# Patient Record
Sex: Female | Born: 1985 | Race: White | Hispanic: No | Smoking: Never smoker
Health system: Southern US, Community
[De-identification: ages and names within clinical notes are randomized; demographics above are authoritative.]

## PROBLEM LIST (undated history)

## (undated) DIAGNOSIS — U071 COVID-19: Secondary | ICD-10-CM

## (undated) HISTORY — PX: LUMBAR MICRODISCECTOMY: SHX99

## (undated) HISTORY — DX: COVID-19: U07.1

## (undated) HISTORY — PX: CHOLECYSTECTOMY: SHX55

## (undated) HISTORY — PX: APPENDECTOMY: SHX54

## (undated) HISTORY — PX: FRACTURE SURGERY: SHX138

---
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
## 2008-11-04 ENCOUNTER — Emergency Department (HOSPITAL_COMMUNITY): Admission: EM | Admit: 2008-11-04 | Discharge: 2008-11-05 | Payer: Self-pay | Admitting: Emergency Medicine

## 2008-11-24 ENCOUNTER — Ambulatory Visit (HOSPITAL_COMMUNITY): Admission: RE | Admit: 2008-11-24 | Discharge: 2008-11-25 | Payer: Self-pay | Admitting: Surgery

## 2008-11-24 ENCOUNTER — Encounter (INDEPENDENT_AMBULATORY_CARE_PROVIDER_SITE_OTHER): Payer: Self-pay | Admitting: Surgery
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
## 2010-09-04 IMAGING — US ABDOMEN
1 series · 14 of 25 positions shown · non-contrast
Comparison: None available.

CLINICAL DATA: Abdominal pain.

COMPLETE ABDOMINAL ULTRASOUND

[Series 1: abdomen · 0.30mm/px · 14 of 52 slices shown]
[im 1/52]
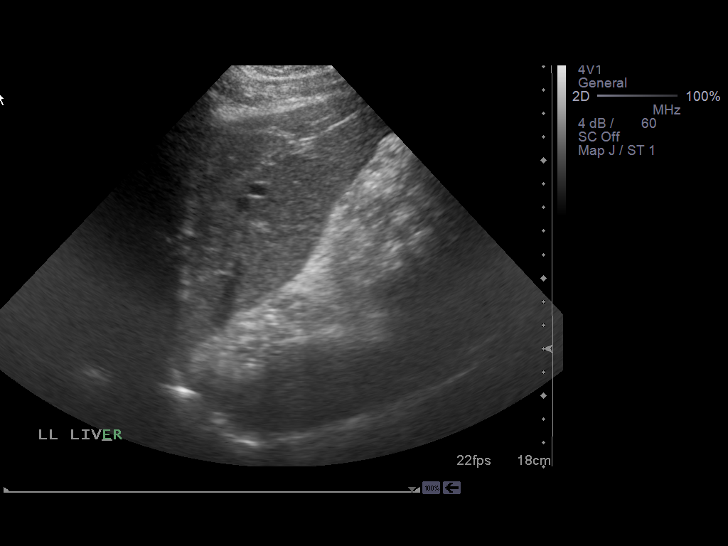
[im 5/52]
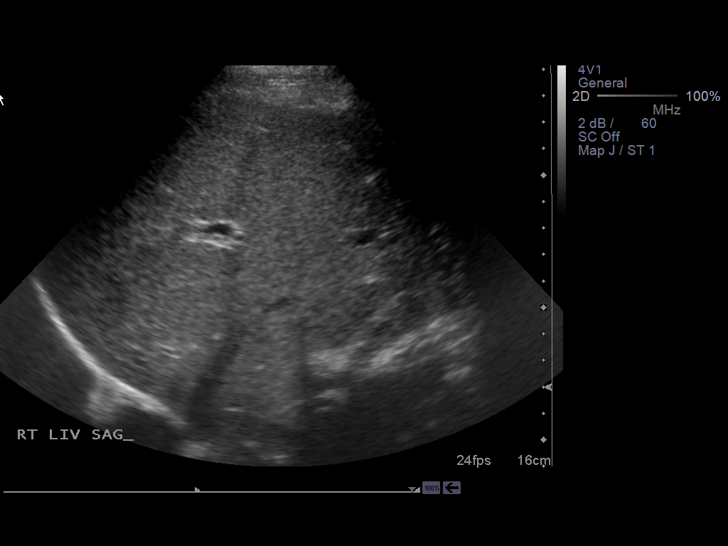
[im 9/52]
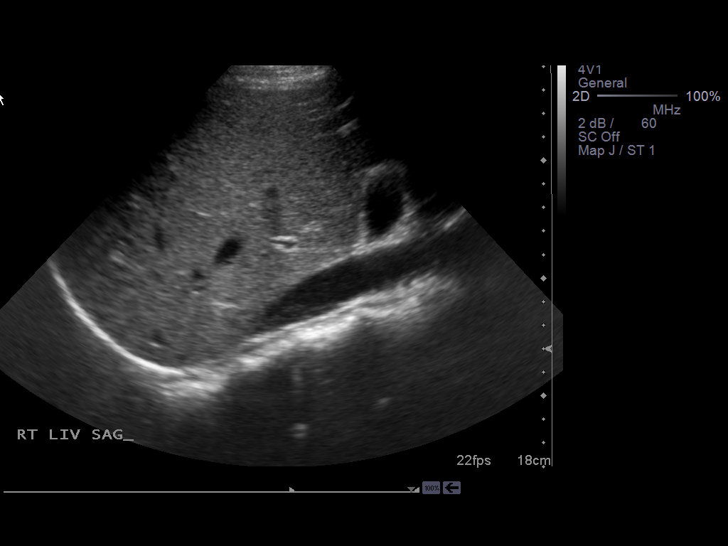
[im 13/52]
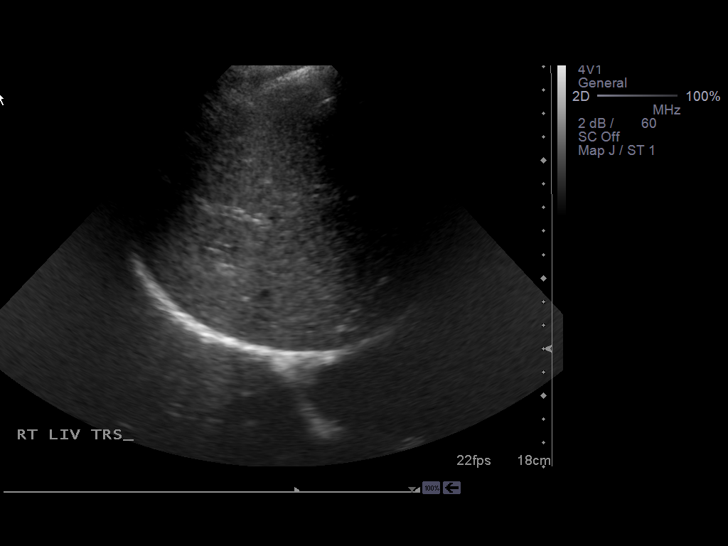
[im 18/52]
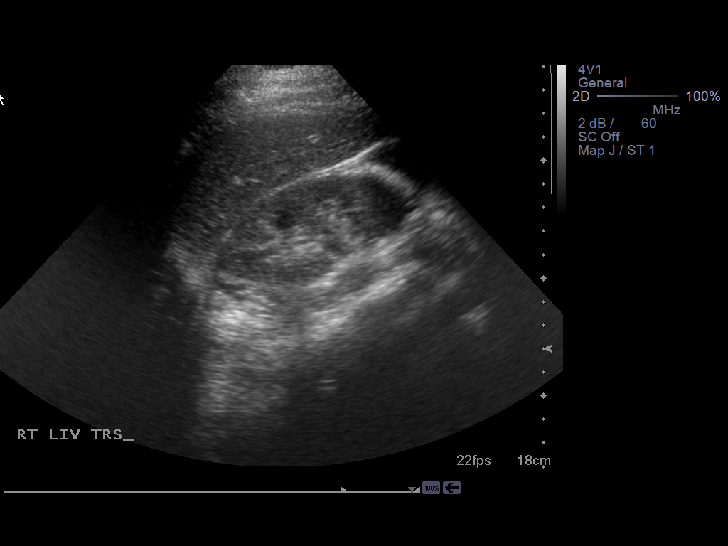
[im 20/52]
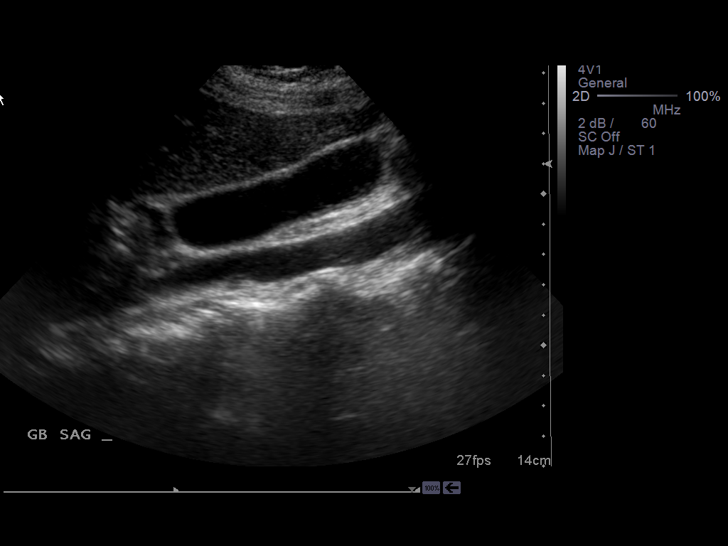
[im 24/52]
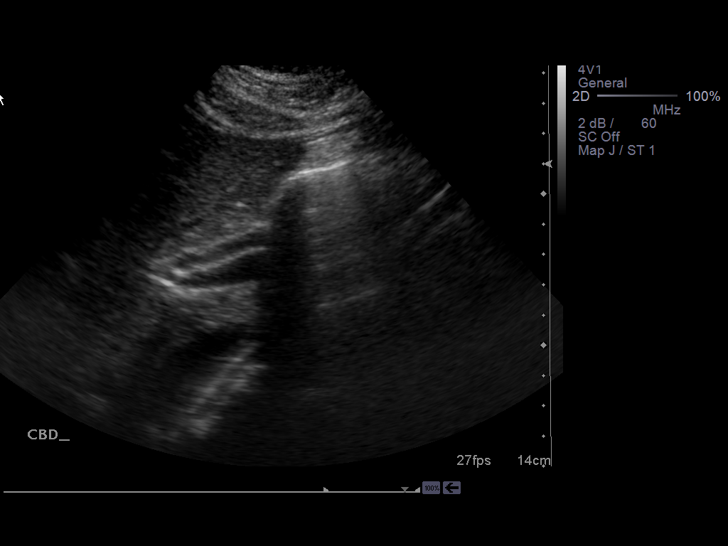
[im 28/52]
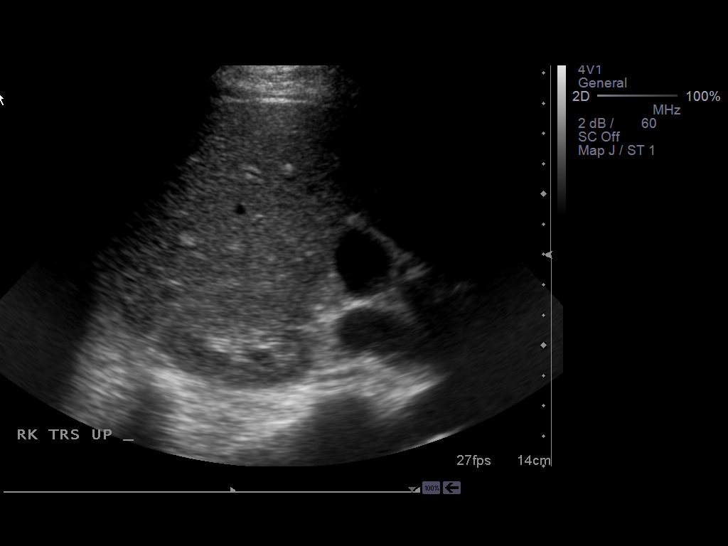
[im 32/52]
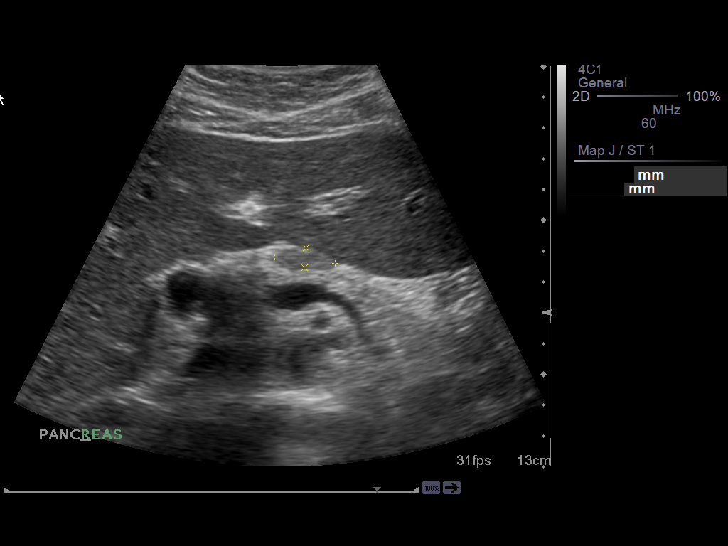
[im 35/52]
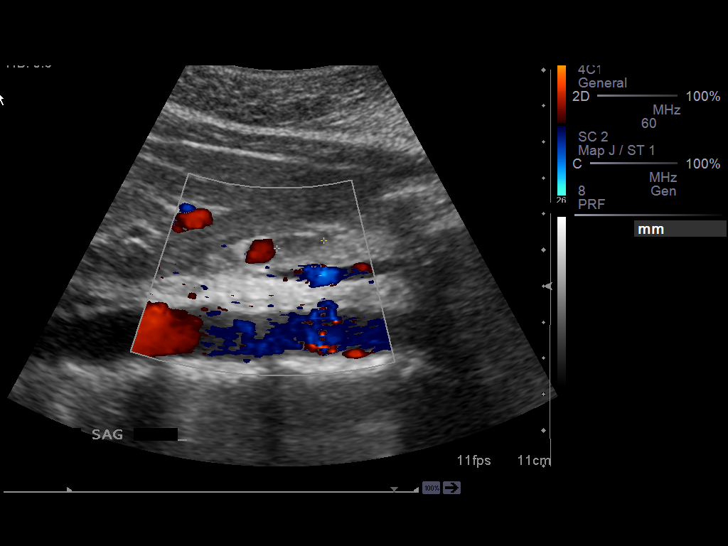
[im 39/52]
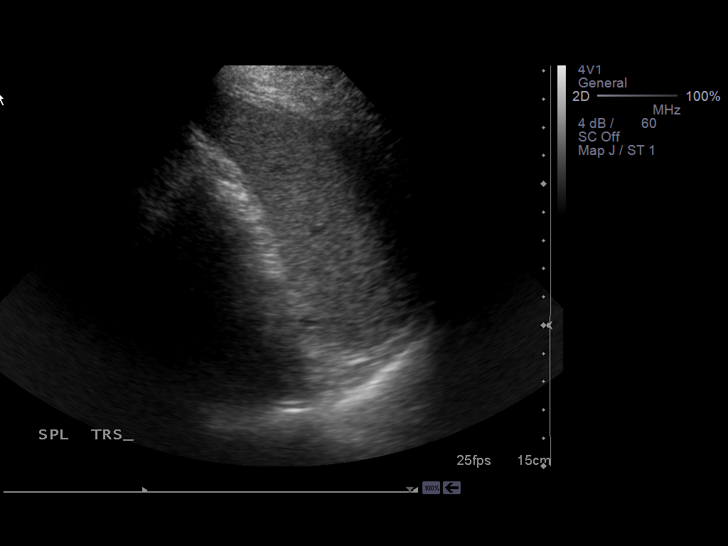
[im 43/52]
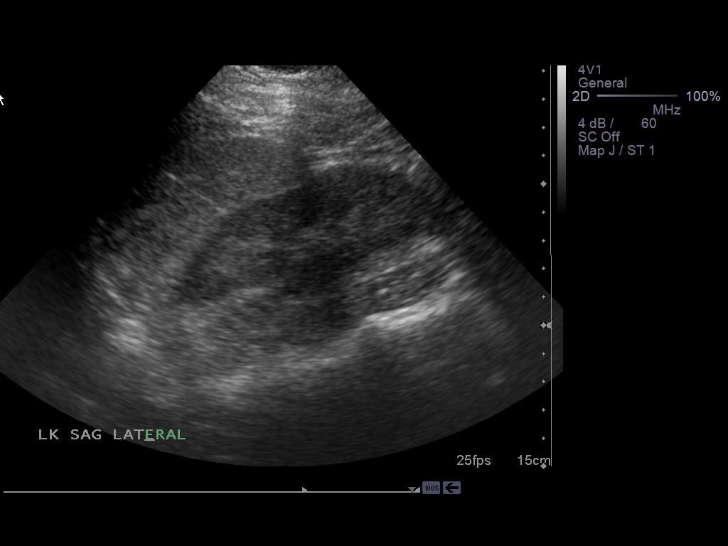
[im 47/52]
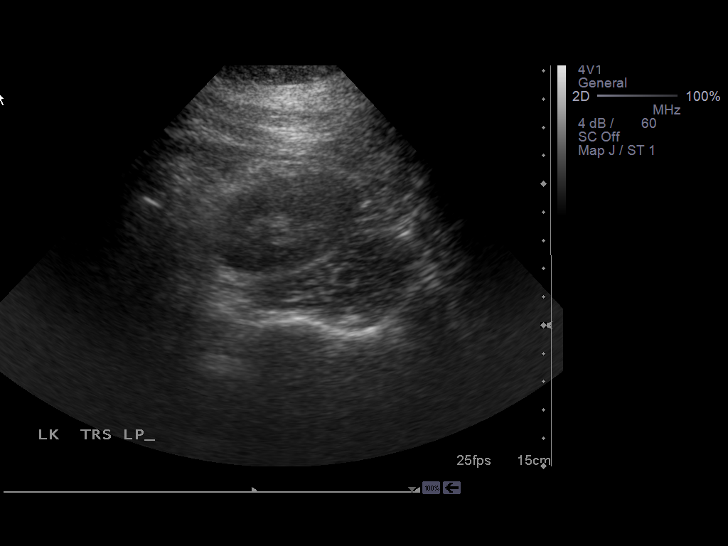
[im 52/52]
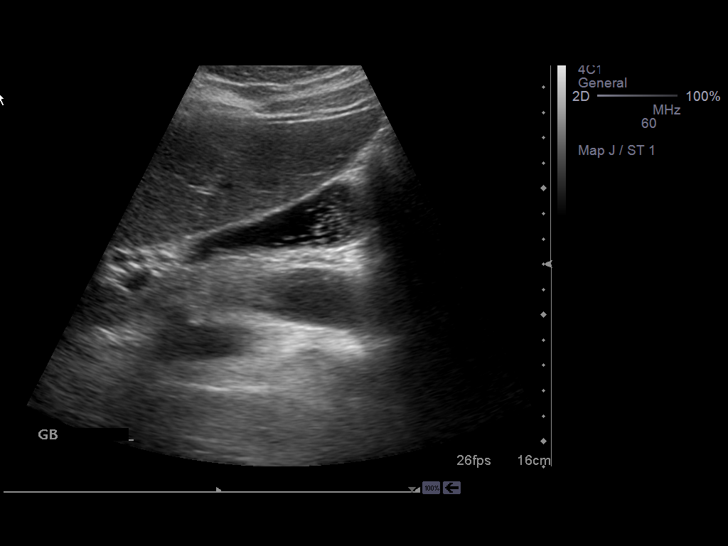

[14 of 25 positions shown; findings below may reference images not displayed]

FINDINGS: Gallbladder:  Sludge is present within the gallbladder.  There are
no stones and no wall thickening or pericholecystic fluid.
Sonographer reports negative Murphy's sign.

Common bile duct:  Normal measuring 4.2 mm.

Liver:  Appears normal.

IVC:  Appears normal.

Pancreas:  Appears normal.  Small hypoechoic focus anterior to the
pancreas measuring 1.3 x 0.7 x 2.0 cm is likely a lymph node.

Spleen:  Appears normal measuring 11.4 cm.

Right Kidney:  Appears normal measuring 11.7 cm.

Left Kidney:  Appears normal measuring 11.6 cm.

Abdominal aorta:  Within normal limits measuring 2.1 cm.
IMPRESSION: Sludge is present within the gallbladder but there is no ultrasound
evidence of acute cholecystitis.  Examinations otherwise
unremarkable.

## 2010-09-20 LAB — CBC
Platelets: 253 10*3/uL (ref 150–400)
RDW: 12.2 % (ref 11.5–15.5)

## 2010-09-20 LAB — BASIC METABOLIC PANEL
BUN: 7 mg/dL (ref 6–23)
Calcium: 9.8 mg/dL (ref 8.4–10.5)
GFR calc non Af Amer: >60 mL/min (ref >60)
Glucose, Bld: 97 mg/dL (ref 70–99)

## 2010-09-21 LAB — CBC
HCT: 41.3 % (ref 36.0–46.0)
MCV: 92.8 fL (ref 78.0–100.0)
Platelets: 178 10*3/uL (ref 150–400)
RDW: 12.6 % (ref 11.5–15.5)
WBC: 12 10*3/uL — ABNORMAL HIGH (ref 4.0–10.5)

## 2010-09-21 LAB — COMPREHENSIVE METABOLIC PANEL
Albumin: 4 g/dL (ref 3.5–5.2)
BUN: 6 mg/dL (ref 6–23)
Chloride: 108 mEq/L (ref 96–112)
Creatinine, Ser: 0.79 mg/dL (ref 0.4–1.2)
Total Bilirubin: 1.8 mg/dL — ABNORMAL HIGH (ref 0.3–1.2)

## 2010-09-21 LAB — DIFFERENTIAL
Basophils Absolute: 0.1 10*3/uL (ref 0.0–0.1)
Lymphocytes Relative: 27 % (ref 12–46)
Monocytes Absolute: 0.8 10*3/uL (ref 0.1–1.0)
Neutro Abs: 7.9 10*3/uL — ABNORMAL HIGH (ref 1.7–7.7)

## 2010-09-21 LAB — LIPASE, BLOOD: Lipase: 29 U/L (ref 11–59)

## 2010-09-21 LAB — URINE CULTURE

## 2010-10-26 NOTE — Op Note (Signed)
Sherry, Luna                ACCOUNT NO.:  [id number]   MEDICAL RECORD NO.:  [id number]          PATIENT TYPE:  OIB   LOCATION:  5124                         FACILITY:  [facility]   PHYSICIAN:  Ardeth Sportsman, MD     DATE OF BIRTH:  1986/05/27   DATE OF PROCEDURE:  DATE OF DISCHARGE:                               OPERATIVE REPORT   PRIMARY CARE PHYSICIAN:  Maryelizabeth Rowan, MD   EMERGENCY ROOM PHYSICIAN:  Hassan Buckler. Caporossi, MD   SURGEON:  Ardeth Sportsman, MD   ASSISTANT:  RN.   PREOPERATIVE DIAGNOSIS:  Symptomatic cholecystolithiasis with probable  chronic cholecystitis.   POSTOPERATIVE DIAGNOSIS:  Symptomatic cholecystolithiasis with probable  chronic cholecystitis.   PROCEDURE PERFORMED:  Laparoscopic cholecystectomy with intraoperative  cholangiogram.   ANESTHESIA:  1. General anesthesia.  2. Local anesthetic and a field block around all port sites.   SPECIMEN:  Gallbladder.   DRAINS:  None.   ESTIMATED BLOOD LOSS:  Less than 10 mL.   COMPLICATIONS:  None apparent.   INDICATIONS:  Ms. Sherry Luna is a 25 year old female with an episode of  intense biliary colic with recurrent episodes that intensified over the  past few weeks.  She had evaluation and was known to have stones.  She  also had an increase in her bilirubin.  She is otherwise healthy with  differential diagnosis.  This is otherwise underwhelming.   The anatomy and physiology of hepatobiliary and pancreatic function was  discussed.  The pathophysiology of cholecystolithiasis with its natural  history and risks were discussed.  Options were discussed.  Recommendation was made for laparoscopic cholecystectomy with  intraoperative cholangiogram.  Risks, benefits, and alternatives were  discussed.  Questions were answered, and she agreed to proceed.   OPERATIVE FINDINGS:  Her gallbladder wall was not particularly  thickened, but she had moderate adhesions to her gallbladder suspicious  for chronic  cholecystitis.   DESCRIPTION OF PROCEDURE:  Informed consent was confirmed.  The patient  underwent general anesthesia without any difficulty.  She had voided  just prior entering the operating room.  She had sequential compression  devices active during the entire case.  She was positioned in supine,  both arms tucked.  Her abdomen was prepped and draped in sterile  fashion.   A #5-mm port was placed in the right upper quadrant using optical entry  technique with the patient in steep reverse Trendelenburg and right side  up.  A camera inspection revealed no intraabdominal injury.  Under  direct visualization, 5-mm ports were placed in the subxiphoid region  and in the right flank.  A 10-mm port was placed through the umbilicus.   The gallbladder fundus was grasped and elevated cephalad.  Moderate  adhesions of greater omentum and mesocolon were carefully isolated and  freed off using a blunt dissection as well as controlled cautery.  Dissection was done on the gallbladder to free the proximal half of the  gallbladder off the liver bed.   Dissection continued until I got a good critical view with 2 structures  leaving from the gallbladder down to  the porta hepatis.  One was going  on the anteromedial wall of the gallbladder and pulsatile consistent  with the cystic artery.  One clip on the gallbladder side and 2 clips on  the proximal were made on this and was transected.  The remaining  structure going from the infundibulum down to porta hepatis was  consistent with the cystic duct.  One clip was placed on the  infundibulum and partial cystic ductotomy  was performed.  A  5-0  Cholangiocatheter was passed through a subcostal puncture site and  entered into the cystic duct.   A cholangiogram was run using dilute radiopaque contrast and continuous  fluoroscopy.  Contrast flowed well from a helical side branch,  consistent with cystic duct cannulization.  This was slightly folded   over itself but it  obviously went into common hepatic duct.  Contrast  refluxed into the right and left intrahepatic chains across the normal  common bile duct across the normal ampulla into a normal duodenum, which  is normal as well.  This is consistent with the normal cholangiogram.  The cholangiocath was removed.  Four clips were placed in the cystic  duct, and cystic duct transection was completed.   The gallbladder was freed from its main attachments to the liver bed and  removed out of the umbilical port without any difficulty.  Copious  irrigation was done with clear return.  Hemostasis was insured.  Inspection was made on the cystic duct and arterial stumps, they were  intact and clean.  There was no evidence of any bleeding or leak of  bile.  Capnoperitoneum was evacuated and ports were removed.  The  umbilical fascial defect was closed using 0 Vicryl stitch without  difficulty.  Skin was closed using 4-0 Monocryl stitch.  The patient was  extubated and sent to recovery room in stable condition.   I discussed postoperative care in detail with the patient and her mother  in the office the other week and again discussed with the patient just  prior to surgery.  I discussed it with her parents also.      Ardeth Sportsman, MD  Electronically Signed     SCG/MEDQ  D:  11/24/2008  T:  11/25/2008  Job:  [id number]   cc:   Maryelizabeth Rowan, M.D.  Jeffrey P. Weldon Inches, MD

## 2010-10-29 NOTE — Op Note (Signed)
NAMELEMPI, EDWIN                          ACCOUNT NO.:  [id number]   MEDICAL RECORD NO.:  [id number]                   PATIENT TYPE:  INP   LOCATION:  0101                                 FACILITY:  [facility]   PHYSICIAN:  Prabhakar D. Pendse, M.D.           DATE OF BIRTH:  11/23/1985   DATE OF PROCEDURE:  08/09/2002  DATE OF DISCHARGE:                                 OPERATIVE REPORT   PREOPERATIVE DIAGNOSES:  1. Acute appendicitis, retrocecal.  2. Pigmented mole of right groin, 1 cm.   POSTOPERATIVE DIAGNOSES:  1. Acute appendicitis, retrocecal.  2. Pigmented mole of right groin, 1 cm.   OPERATION PERFORMED:  1. Exploratory laparotomy and appendectomy.  2. Excision of pigmented mole of right groin and repair.   SURGEON:  Prabhakar D. Levie Heritage, M.D.   ASSISTANT:  Nurse.   ANESTHESIA:  Nurse.   OPERATIVE INDICATIONS:  This is a 25 year old overweight girl who was seen  in [facility] Emergency Room, with about a 12-hour history of  persistent mid and right-sided abdominal pain associated with anorexia.  There was no history of vomiting, no diarrhea, no dysuria, no URI.  Past  history was unremarkable.  Physical examination showed localized right lower  quadrant signs.  CBC showed WBC count of 21,200 with a shift to the left.  Urinalysis was normal.  CT scan of the abdomen showed retrocecal  appendicitis.  Exploration was planned.   OPERATIVE FINDINGS:  Upon entering the right lower quadrant area there was a  moderate amount of straw-colored fluid in the right lower quadrant.  There  was no odor.  The appendix was retrocecal, measuring about three inches  long, with its distal two-thirds markedly distended, congested, edematous,  without any signs of perforation.  The examination of the distal ileum  showed no evidence of ileitis or evidence of Meckel's diverticula.   The pigmented mole was 1.0 cm with irregular margins, and some dysplastic  features.   OPERATIVE PROCEDURE:  Under satisfactory general endotracheal anesthesia,  the patient in supine position, the abdomen and groin regions were  thoroughly prepped and draped in the usual manner.  About a three-inch long  transverse incision was made in the right lower quadrant area over the  lateral aspect.  Skin, subcutaneous tissue incised.  Bleeders digitally  clamped, cut and electrocoagulated.  Muscles incised in the ______  fashion.  Peritoneal cavity entered.  The findings were as described above.   The cecum and the distal ileum area was gently exteriorized, in a limited  fashion.  The appendix base was identified and because the appendix was  retrocecal, retrograde appendectomy was done.  Bleeders were clamped, cut  and ligated with 2-0 silk.  The appendectomy was done in the routine  fashion.  The stump was ligated with 2-0 Vicryl and buried in the cecal  wall, with a 3-0 silk pursestring suture.  Examination of the distal  ileum  was carried out, findings there as described above.  Sponge and needle count  being correct, the peritoneal cavity was irrigated and peritoneum closed  with 2-0 Vicryl running, interlocking sutures.  Individual muscles closed  with 2-0 Vicryl interrupted sutures, and subcutaneous tissue closed with 2-0  Vicryl.  Skin was closed with 4-0 Monocryl subcuticular sutures.  Steri-  Strips applied.   The patient's general condition being satisfactory, attention was diverted  to the pigmented mole of the right groin.  An elliptical incision was made  around the pigmented mole, and the entire mole was removed.  The repair was  done with 5-0 nylon interrupted as well as running interlocking sutures.  Appropriate dressing was applied.   Throughout the procedure the patient's vital signs remained stable.  The  patient withstood the procedure well, and was transferred to the recovery  room in satisfactory general condition.                                                Prabhakar D. Levie Heritage, M.D.    PDP/MEDQ  D:  08/09/2002  T:  08/09/2002  Job:  [id number]   cc:   Molly Maduro A. Nicholos Johns, M.D.  [address]  [place]  [place] [address]  Fax: [phone]

## 2012-05-23 ENCOUNTER — Other Ambulatory Visit: Payer: Self-pay | Admitting: Obstetrics and Gynecology

## 2013-05-28 ENCOUNTER — Other Ambulatory Visit: Payer: Self-pay | Admitting: Obstetrics and Gynecology

## 2013-10-25 ENCOUNTER — Ambulatory Visit: Payer: Self-pay | Admitting: Family Medicine

## 2015-10-05 DIAGNOSIS — D170 Benign lipomatous neoplasm of skin and subcutaneous tissue of head, face and neck: Secondary | ICD-10-CM | POA: Insufficient documentation

## 2015-11-18 ENCOUNTER — Encounter (HOSPITAL_BASED_OUTPATIENT_CLINIC_OR_DEPARTMENT_OTHER): Payer: Self-pay | Admitting: *Deleted

## 2015-11-24 ENCOUNTER — Other Ambulatory Visit: Payer: Self-pay | Admitting: Plastic Surgery

## 2015-11-24 DIAGNOSIS — D170 Benign lipomatous neoplasm of skin and subcutaneous tissue of head, face and neck: Secondary | ICD-10-CM

## 2015-11-24 NOTE — H&P (Signed)
Sherry Luna is an 30 y.o. female.   Chief Complaint: neck lipoma HPI: The patient is a 30 y.o. yrs old wf here for history and physical for excision of a mass on the posterior neck area. She states it has been there for the past several years but getting larger. There is no nerve affect noted with a decrease in any activities but she does complain of pressure in the area. She has full range of motion. The skin is without an involvement. It is soft and ~ 5 x 6 cm in size on the right side of the posterior neck. There is no sign of infection. No other lesions are noted. There is no lymphadenopathy of the supraclavicular area or cervical area. Nothing makes it better and it is getting larger with time.   No past medical history on file.  Past Surgical History  Procedure Laterality Date  . Fracture surgery    . Appendectomy    . Cholecystectomy      No family history on file. Social History:  reports that she has never smoked. She has never used smokeless tobacco. She reports that she drinks alcohol. She reports that she does not use illicit drugs.  Allergies:  Allergies  Allergen Reactions  . Ceftin [Cefuroxime Axetil] Shortness Of Breath  . Ciprofloxacin Shortness Of Breath  . Zithromax [Azithromycin] Swelling     (Not in a hospital admission)  No results found for this or any previous visit (from the past 48 hour(s)). No results found.  ROS  There were no vitals taken for this visit. Physical Exam  Constitutional: She is oriented to person, place, and time. She appears well-developed and well-nourished.  HENT:  Head: Normocephalic and atraumatic.  Eyes: Conjunctivae are normal. Pupils are equal, round, and reactive to light.  Neck:    Cardiovascular: Normal rate.   Respiratory: Effort normal. No respiratory distress. She has no wheezes.  GI: Soft. She exhibits no distension. There is no tenderness.  Neurological: She is alert and oriented to person, place, and time.    Skin: Skin is warm.  Psychiatric: She has a normal mood and affect. Her behavior is normal. Judgment and thought content normal.     Assessment/Plan Excision of Neck lipoma  [name], DO 11/24/2015, 8:05 AM

## 2015-11-26 ENCOUNTER — Encounter (HOSPITAL_BASED_OUTPATIENT_CLINIC_OR_DEPARTMENT_OTHER): Payer: Self-pay | Admitting: Plastic Surgery

## 2015-11-26 ENCOUNTER — Encounter (HOSPITAL_BASED_OUTPATIENT_CLINIC_OR_DEPARTMENT_OTHER)
Admission: RE | Disposition: A | Discharge status: Home / Self Care | Payer: Self-pay | Source: Ambulatory Visit | Attending: Plastic Surgery

## 2015-11-26 ENCOUNTER — Ambulatory Visit (HOSPITAL_BASED_OUTPATIENT_CLINIC_OR_DEPARTMENT_OTHER)
Admission: RE | Admit: 2015-11-26 | Discharge: 2015-11-26 | Disposition: A | Discharge status: Home / Self Care | Payer: BLUE CROSS/BLUE SHIELD | Source: Ambulatory Visit | Attending: Plastic Surgery | Admitting: Plastic Surgery

## 2015-11-26 ENCOUNTER — Ambulatory Visit (HOSPITAL_BASED_OUTPATIENT_CLINIC_OR_DEPARTMENT_OTHER): Payer: BLUE CROSS/BLUE SHIELD | Admitting: Anesthesiology

## 2015-11-26 DIAGNOSIS — D170 Benign lipomatous neoplasm of skin and subcutaneous tissue of head, face and neck: Secondary | ICD-10-CM | POA: Insufficient documentation

## 2015-11-26 HISTORY — PX: LIPOMA EXCISION: SHX5283

## 2015-11-26 SURGERY — EXCISION LIPOMA
Anesthesia: General | Site: Neck | Laterality: Right

## 2015-11-26 MED ORDER — GLYCOPYRROLATE 0.2 MG/ML IJ SOLN: 0.2000 mg | Freq: Once | INTRAMUSCULAR | Status: DC | PRN

## 2015-11-26 MED ORDER — LIDOCAINE-EPINEPHRINE 1 %-1:100000 IJ SOLN
INTRAMUSCULAR | Status: AC
  Filled 2015-11-26: qty 1

## 2015-11-26 MED ORDER — LACTATED RINGERS IV SOLN: INTRAVENOUS | Status: DC

## 2015-11-26 MED ORDER — GLYCOPYRROLATE 0.2 MG/ML IJ SOLN
INTRAMUSCULAR | Status: DC | PRN
  Administered 2015-11-26: 0.4 mg via INTRAVENOUS

## 2015-11-26 MED ORDER — MIDAZOLAM HCL 2 MG/2ML IJ SOLN
INTRAMUSCULAR | Status: AC
  Filled 2015-11-26: qty 2

## 2015-11-26 MED ORDER — VANCOMYCIN HCL 1000 MG IV SOLR
1000.0000 mg | INTRAVENOUS | Status: DC | PRN
  Administered 2015-11-26: 1000 mg via INTRAVENOUS

## 2015-11-26 MED ORDER — VANCOMYCIN HCL IN DEXTROSE 1-5 GM/200ML-% IV SOLN
INTRAVENOUS | Status: AC
  Filled 2015-11-26: qty 200

## 2015-11-26 MED ORDER — ARTIFICIAL TEARS OP OINT
TOPICAL_OINTMENT | OPHTHALMIC | Status: AC
  Filled 2015-11-26: qty 3.5

## 2015-11-26 MED ORDER — BUPIVACAINE-EPINEPHRINE (PF) 0.25% -1:200000 IJ SOLN
INTRAMUSCULAR | Status: AC
  Filled 2015-11-26: qty 30

## 2015-11-26 MED ORDER — FENTANYL CITRATE (PF) 100 MCG/2ML IJ SOLN: 25.0000 ug | INTRAMUSCULAR | Status: DC | PRN

## 2015-11-26 MED ORDER — DEXAMETHASONE SODIUM PHOSPHATE 10 MG/ML IJ SOLN
INTRAMUSCULAR | Status: AC
  Filled 2015-11-26: qty 1

## 2015-11-26 MED ORDER — FENTANYL CITRATE (PF) 100 MCG/2ML IJ SOLN
INTRAMUSCULAR | Status: AC
  Filled 2015-11-26: qty 2

## 2015-11-26 MED ORDER — FENTANYL CITRATE (PF) 100 MCG/2ML IJ SOLN
INTRAMUSCULAR | Status: DC | PRN
  Administered 2015-11-26: 100 ug via INTRAVENOUS

## 2015-11-26 MED ORDER — BACITRACIN ZINC 500 UNIT/GM EX OINT
TOPICAL_OINTMENT | CUTANEOUS | Status: AC
  Filled 2015-11-26: qty 1.8

## 2015-11-26 MED ORDER — LIDOCAINE-EPINEPHRINE 1 %-1:100000 IJ SOLN
INTRAMUSCULAR | Status: DC | PRN
  Administered 2015-11-26: 10 mL

## 2015-11-26 MED ORDER — ONDANSETRON HCL 4 MG/2ML IJ SOLN
INTRAMUSCULAR | Status: DC | PRN
  Administered 2015-11-26: 4 mg via INTRAVENOUS

## 2015-11-26 MED ORDER — LIDOCAINE HCL (CARDIAC) 20 MG/ML IV SOLN
INTRAVENOUS | Status: DC | PRN
  Administered 2015-11-26: 100 mg via INTRAVENOUS

## 2015-11-26 MED ORDER — DEXAMETHASONE SODIUM PHOSPHATE 4 MG/ML IJ SOLN
INTRAMUSCULAR | Status: DC | PRN
  Administered 2015-11-26: 10 mg via INTRAVENOUS

## 2015-11-26 MED ORDER — FENTANYL CITRATE (PF) 100 MCG/2ML IJ SOLN: 50.0000 ug | INTRAMUSCULAR | Status: DC | PRN

## 2015-11-26 MED ORDER — LACTATED RINGERS IV SOLN
INTRAVENOUS | Status: DC | PRN
  Administered 2015-11-26: 08:00:00 via INTRAVENOUS

## 2015-11-26 MED ORDER — LIDOCAINE 2% (20 MG/ML) 5 ML SYRINGE
INTRAMUSCULAR | Status: AC
  Filled 2015-11-26: qty 5

## 2015-11-26 MED ORDER — PROMETHAZINE HCL 25 MG/ML IJ SOLN: 6.2500 mg | INTRAMUSCULAR | Status: DC | PRN

## 2015-11-26 MED ORDER — SCOPOLAMINE 1 MG/3DAYS TD PT72: 1.0000 | MEDICATED_PATCH | Freq: Once | TRANSDERMAL | Status: DC | PRN

## 2015-11-26 MED ORDER — ONDANSETRON HCL 4 MG/2ML IJ SOLN
INTRAMUSCULAR | Status: AC
  Filled 2015-11-26: qty 2

## 2015-11-26 MED ORDER — PROPOFOL 10 MG/ML IV BOLUS
INTRAVENOUS | Status: DC | PRN
  Administered 2015-11-26: 150 mg via INTRAVENOUS

## 2015-11-26 MED ORDER — MIDAZOLAM HCL 5 MG/5ML IJ SOLN
INTRAMUSCULAR | Status: DC | PRN
  Administered 2015-11-26: 2 mg via INTRAVENOUS

## 2015-11-26 MED ORDER — 0.9 % SODIUM CHLORIDE (POUR BTL) OPTIME
TOPICAL | Status: DC | PRN
  Administered 2015-11-26: 150 mL

## 2015-11-26 MED ORDER — MIDAZOLAM HCL 2 MG/2ML IJ SOLN: 1.0000 mg | INTRAMUSCULAR | Status: DC | PRN

## 2015-11-26 MED ORDER — SUCCINYLCHOLINE CHLORIDE 20 MG/ML IJ SOLN
INTRAMUSCULAR | Status: DC | PRN
  Administered 2015-11-26: 100 mg via INTRAVENOUS

## 2015-11-26 MED ORDER — HYDROCODONE-ACETAMINOPHEN 5-325 MG PO TABS: 1.0000 | ORAL_TABLET | Freq: Four times a day (QID) | ORAL | Status: DC | PRN

## 2015-11-26 MED ORDER — SUCCINYLCHOLINE CHLORIDE 200 MG/10ML IV SOSY
PREFILLED_SYRINGE | INTRAVENOUS | Status: AC
  Filled 2015-11-26: qty 10

## 2015-11-26 SURGICAL SUPPLY — 45 items
BLADE CLIPPER SURG (BLADE) IMPLANT
BLADE HEX COATED 2.75 (ELECTRODE) IMPLANT
BLADE SURG 15 STRL LF DISP TIS (BLADE) ×1 IMPLANT
BLADE SURG 15 STRL SS (BLADE) ×3
CANISTER SUCT 1200ML W/VALVE (MISCELLANEOUS) IMPLANT
CHLORAPREP W/TINT 26ML (MISCELLANEOUS) ×3 IMPLANT
CLOSURE WOUND 1/2 X4 (GAUZE/BANDAGES/DRESSINGS) ×1
COVER BACK TABLE 60X90IN (DRAPES) ×3 IMPLANT
COVER MAYO STAND STRL (DRAPES) ×3 IMPLANT
DERMABOND ADVANCED (GAUZE/BANDAGES/DRESSINGS) ×2
DERMABOND ADVANCED .7 DNX12 (GAUZE/BANDAGES/DRESSINGS) ×1 IMPLANT
DRAPE LAPAROTOMY 100X72 PEDS (DRAPES) IMPLANT
DRAPE U-SHAPE 76X120 STRL (DRAPES) ×3 IMPLANT
ELECT COATED BLADE 2.86 ST (ELECTRODE) ×3 IMPLANT
ELECT REM PT RETURN 9FT ADLT (ELECTROSURGICAL) ×3
ELECTRODE REM PT RTRN 9FT ADLT (ELECTROSURGICAL) ×1 IMPLANT
GLOVE BIO SURGEON STRL SZ 6.5 (GLOVE) ×4 IMPLANT
GLOVE BIO SURGEONS STRL SZ 6.5 (GLOVE) ×2
GLOVE BIOGEL PI IND STRL 7.0 (GLOVE) ×1 IMPLANT
GLOVE BIOGEL PI INDICATOR 7.0 (GLOVE) ×2
GLOVE ECLIPSE 6.5 STRL STRAW (GLOVE) ×3 IMPLANT
GOWN STRL REUS W/ TWL LRG LVL3 (GOWN DISPOSABLE) ×3 IMPLANT
GOWN STRL REUS W/TWL LRG LVL3 (GOWN DISPOSABLE) ×9
NEEDLE HYPO 25X1 1.5 SAFETY (NEEDLE) ×3 IMPLANT
NS IRRIG 1000ML POUR BTL (IV SOLUTION) ×3 IMPLANT
PACK BASIN DAY SURGERY FS (CUSTOM PROCEDURE TRAY) ×3 IMPLANT
PENCIL BUTTON HOLSTER BLD 10FT (ELECTRODE) ×3 IMPLANT
SPONGE GAUZE 2X2 8PLY STER LF (GAUZE/BANDAGES/DRESSINGS) ×2
SPONGE GAUZE 2X2 8PLY STRL LF (GAUZE/BANDAGES/DRESSINGS) ×4 IMPLANT
SPONGE GAUZE 4X4 12PLY STER LF (GAUZE/BANDAGES/DRESSINGS) IMPLANT
STRIP CLOSURE SKIN 1/2X4 (GAUZE/BANDAGES/DRESSINGS) ×2 IMPLANT
SUCTION FRAZIER HANDLE 10FR (MISCELLANEOUS)
SUCTION TUBE FRAZIER 10FR DISP (MISCELLANEOUS) IMPLANT
SUT ETHILON 4 0 PS 2 18 (SUTURE) IMPLANT
SUT MNCRL AB 4-0 PS2 18 (SUTURE) ×3 IMPLANT
SUT MON AB 3-0 SH 27 (SUTURE) ×3
SUT MON AB 3-0 SH27 (SUTURE) ×1 IMPLANT
SUT MON AB 5-0 PS2 18 (SUTURE) ×3 IMPLANT
SYR BULB 3OZ (MISCELLANEOUS) IMPLANT
SYR CONTROL 10ML LL (SYRINGE) ×3 IMPLANT
TOWEL OR 17X24 6PK STRL BLUE (TOWEL DISPOSABLE) ×6 IMPLANT
TRAY DSU PREP LF (CUSTOM PROCEDURE TRAY) IMPLANT
TUBE CONNECTING 20'X1/4 (TUBING)
TUBE CONNECTING 20X1/4 (TUBING) IMPLANT
YANKAUER SUCT BULB TIP NO VENT (SUCTIONS) IMPLANT

## 2015-11-26 NOTE — Op Note (Addendum)
Operative Note   DATE OF OPERATION: 11/26/2015  LOCATION:  [place]  SURGICAL DIVISION: Plastic Surgery  PREOPERATIVE DIAGNOSES:  Posterior neck lipoma  POSTOPERATIVE DIAGNOSES:  same  PROCEDURE:  Excision of posterior neck lipoma  6 x 7 cm  SURGEON: Avyonna Wagoner Sanger Shadai Mcclane, DO  ASSISTANT: Shawn Rayburn, PA  ANESTHESIA:  General.   COMPLICATIONS: None.   INDICATIONS FOR PROCEDURE:  The patient, Sherry Luna is a 30 y.o. female born on 1985/11/09, is here for treatment of a growing neck lipoma. MRN: [id number]  CONSENT:  Informed consent was obtained directly from the patient. Risks, benefits and alternatives were fully discussed. Specific risks including but not limited to bleeding, infection, hematoma, seroma, scarring, pain, infection, contracture, asymmetry, wound healing problems, and need for further surgery were all discussed. The patient did have an ample opportunity to have questions answered to satisfaction.   DESCRIPTION OF PROCEDURE:  The patient was taken to the operating room. SCDs were placed and IV antibiotics were given. The patient's operative site was prepped and draped in a sterile fashion. A time out was performed and all information was confirmed to be correct.  General anesthesia was administered.  Local was injected around the area for intraoperative hemostasis and postoperative pain control.  The #15 blade was used to make an incision over the lesion.  The scissors and bovie were used to dissect to the sac of the lipoma.  The sac was visualized but was irregular.  As the lesion was freed from the muscle there appeared to be adherent to the muscle.  The bovie was used for hemostasis.  The lesion was excised in total 6 x 7 cm.  Local was injected in to the muscle for postoperative pain control.  The deep layers were closed with 3-0 Monocryl followed by a running 4-0 subcuticular closure.  Monocryl 5-0 was used to reinforce the closure with  vertical mattress sutures.  dermabond and steri strips were applied.  The patient tolerated the procedure well.  There were no complications. The patient was allowed to wake from anesthesia, extubated and taken to the recovery room in satisfactory condition.

## 2015-11-26 NOTE — Anesthesia Preprocedure Evaluation (Signed)
Anesthesia Evaluation  Patient identified by MRN, date of birth, ID band Patient awake    Reviewed: Allergy & Precautions, NPO status , Patient's Chart, lab work & pertinent test results  Airway Mallampati: I  TM Distance: >3 FB Neck ROM: Full    Dental  (+) Teeth Intact, Dental Advisory Given   Pulmonary neg pulmonary ROS,    Pulmonary exam normal breath sounds clear to auscultation       Cardiovascular Exercise Tolerance: Good negative cardio ROS Normal cardiovascular exam Rhythm:Regular Rate:Normal     Neuro/Psych negative neurological ROS  negative psych ROS   GI/Hepatic negative GI ROS, Neg liver ROS,   Endo/Other  negative endocrine ROS  Renal/GU negative Renal ROS     Musculoskeletal negative musculoskeletal ROS (+)   Abdominal   Peds  Hematology negative hematology ROS (+)   Anesthesia Other Findings Day of surgery medications reviewed with the patient.  Reproductive/Obstetrics negative OB ROS                             Anesthesia Physical Anesthesia Plan  ASA: I  Anesthesia Plan: General   Post-op Pain Management:    Induction: Intravenous  Airway Management Planned: Oral ETT  Additional Equipment:   Intra-op Plan:   Post-operative Plan: Extubation in OR  Informed Consent: I have reviewed the patients History and Physical, chart, labs and discussed the procedure including the risks, benefits and alternatives for the proposed anesthesia with the patient or authorized representative who has indicated his/her understanding and acceptance.   Dental advisory given  Plan Discussed with: CRNA  Anesthesia Plan Comments: (Risks/benefits of general anesthesia discussed with patient including risk of damage to teeth, lips, gum, and tongue, nausea/vomiting, allergic reactions to medications, and the possibility of heart attack, stroke and death.  All patient questions  answered.  Patient wishes to proceed.)        Anesthesia Quick Evaluation

## 2015-11-26 NOTE — Brief Op Note (Signed)
11/26/2015  8:49 AM  PATIENT:  Sherry Luna  30 y.o. female  PRE-OPERATIVE DIAGNOSIS:  NECK LIPOMA  POST-OPERATIVE DIAGNOSIS:  NECK LIPOMA  PROCEDURE:  Procedure(s): EXCISION POSTERIOR NECK LIPOMA (Right)  SURGEON:  Surgeon(s) and Role:    * Loel Lofty Mechelle Pates, DO - Primary  PHYSICIAN ASSISTANT: Shawn Rayburn, PA  ASSISTANTS: none   ANESTHESIA:   local and general  EBL:     BLOOD ADMINISTERED:none  DRAINS: none   LOCAL MEDICATIONS USED:  LIDOCAINE   SPECIMEN:  Source of Specimen:  lipoma  DISPOSITION OF SPECIMEN:  PATHOLOGY  COUNTS:  YES  TOURNIQUET:  * No tourniquets in log *  DICTATION: .Dragon Dictation  PLAN OF CARE: Discharge to home after PACU  PATIENT DISPOSITION:  PACU - hemodynamically stable.   Delay start of Pharmacological VTE agent (>24hrs) due to surgical blood loss or risk of bleeding: no

## 2015-11-26 NOTE — Discharge Instructions (Addendum)
Ice to the area for next 24 hours    Post Anesthesia Home Care Instructions  Activity: Get plenty of rest for the remainder of the day. A responsible adult should stay with you for 24 hours following the procedure.  For the next 24 hours, DO NOT: -Drive a car -Paediatric nurse -Drink alcoholic beverages -Take any medication unless instructed by your physician -Make any legal decisions or sign important papers.  Meals: Start with liquid foods such as gelatin or soup. Progress to regular foods as tolerated. Avoid greasy, spicy, heavy foods. If nausea and/or vomiting occur, drink only clear liquids until the nausea and/or vomiting subsides. Call your physician if vomiting continues.  Special Instructions/Symptoms: Your throat may feel dry or sore from the anesthesia or the breathing tube placed in your throat during surgery. If this causes discomfort, gargle with warm salt water. The discomfort should disappear within 24 hours.  If you had a scopolamine patch placed behind your ear for the management of post- operative nausea and/or vomiting:  1. The medication in the patch is effective for 72 hours, after which it should be removed.  Wrap patch in a tissue and discard in the trash. Wash hands thoroughly with soap and water. 2. You may remove the patch earlier than 72 hours if you experience unpleasant side effects which may include dry mouth, dizziness or visual disturbances. 3. Avoid touching the patch. Wash your hands with soap and water after contact with the patch.   Call your surgeon if you experience:   1.  Fever over 101.0. 2.  Inability to urinate. 3.  Nausea and/or vomiting. 4.  Extreme swelling or bruising at the surgical site. 5.  Continued bleeding from the incision. 6.  Increased pain, redness or drainage from the incision. 7.  Problems related to your pain medication. 8.  Any problems and/or concerns

## 2015-11-26 NOTE — Anesthesia Postprocedure Evaluation (Signed)
Anesthesia Post Note  Patient: Sherry Luna  Procedure(s) Performed: Procedure(s) (LRB): EXCISION POSTERIOR NECK LIPOMA (Right)  Patient location during evaluation: PACU Anesthesia Type: General Level of consciousness: awake and alert Pain management: pain level controlled Vital Signs Assessment: post-procedure vital signs reviewed and stable Respiratory status: spontaneous breathing, nonlabored ventilation, respiratory function stable and patient connected to nasal cannula oxygen Cardiovascular status: blood pressure returned to baseline and stable Postop Assessment: no signs of nausea or vomiting Anesthetic complications: no    Last Vitals:  Filed Vitals:   11/26/15 0945 11/26/15 1007  BP: 117/78   Pulse: 69   Temp:  36.4 C  Resp: 16     Last Pain: There were no vitals filed for this visit.               Catalina Gravel

## 2015-11-26 NOTE — Anesthesia Procedure Notes (Signed)
Procedure Name: Intubation Date/Time: 11/26/2015 7:54 AM Performed by: Lyndee Leo Pre-anesthesia Checklist: Patient identified, Emergency Drugs available and Patient being monitored Patient Re-evaluated:Patient Re-evaluated prior to inductionOxygen Delivery Method: Circle system utilized Preoxygenation: Pre-oxygenation with 100% oxygen Intubation Type: IV induction Ventilation: Mask ventilation without difficulty Laryngoscope Size: Mac and 3 Grade View: Grade I Tube type: Oral Tube size: 7.0 mm Number of attempts: 1 Airway Equipment and Method: Stylet and Oral airway Placement Confirmation: ETT inserted through vocal cords under direct vision,  positive ETCO2 and breath sounds checked- equal and bilateral Secured at: 22 cm Tube secured with: Tape Dental Injury: Teeth and Oropharynx as per pre-operative assessment

## 2015-11-26 NOTE — Transfer of Care (Signed)
Immediate Anesthesia Transfer of Care Note  Patient: Sherry Luna  Procedure(s) Performed: Procedure(s): EXCISION POSTERIOR NECK LIPOMA (Right)  Patient Location: PACU  Anesthesia Type:General  Level of Consciousness: awake, sedated and patient cooperative  Airway & Oxygen Therapy: Patient Spontanous Breathing and Patient connected to face mask oxygen  Post-op Assessment: Report given to RN and Post -op Vital signs reviewed and stable  Post vital signs: Reviewed and stable  Last Vitals:  Filed Vitals:   11/26/15 0727  BP: 112/51  Pulse: 48  Temp: 36.6 C  Resp: 18    Last Pain: There were no vitals filed for this visit.       Complications: No apparent anesthesia complications

## 2015-11-26 NOTE — Interval H&P Note (Signed)
History and Physical Interval Note:  11/26/2015 7:18 AM  Sherry Luna  has presented today for surgery, with the diagnosis of NECK LIPOMA  The various methods of treatment have been discussed with the patient and family. After consideration of risks, benefits and other options for treatment, the patient has consented to  Procedure(s): EXCISION POSTERIOR NECK LIPOMA (N/A) as a surgical intervention .  The patient's history has been reviewed, patient examined, no change in status, stable for surgery.  I have reviewed the patient's chart and labs.  Questions were answered to the patient's satisfaction.     [name]

## 2015-11-26 NOTE — H&P (View-Only) (Signed)
Sherry Luna is an 30 y.o. female.   Chief Complaint: neck lipoma HPI: The patient is a 30 y.o. yrs old wf here for history and physical for excision of a mass on the posterior neck area. She states it has been there for the past several years but getting larger. There is no nerve affect noted with a decrease in any activities but she does complain of pressure in the area. She has full range of motion. The skin is without an involvement. It is soft and ~ 5 x 6 cm in size on the right side of the posterior neck. There is no sign of infection. No other lesions are noted. There is no lymphadenopathy of the supraclavicular area or cervical area. Nothing makes it better and it is getting larger with time.   No past medical history on file.  Past Surgical History  Procedure Laterality Date  . Fracture surgery    . Appendectomy    . Cholecystectomy      No family history on file. Social History:  reports that she has never smoked. She has never used smokeless tobacco. She reports that she drinks alcohol. She reports that she does not use illicit drugs.  Allergies:  Allergies  Allergen Reactions  . Ceftin [Cefuroxime Axetil] Shortness Of Breath  . Ciprofloxacin Shortness Of Breath  . Zithromax [Azithromycin] Swelling     (Not in a hospital admission)  No results found for this or any previous visit (from the past 48 hour(s)). No results found.  ROS  There were no vitals taken for this visit. Physical Exam  Constitutional: She is oriented to person, place, and time. She appears well-developed and well-nourished.  HENT:  Head: Normocephalic and atraumatic.  Eyes: Conjunctivae are normal. Pupils are equal, round, and reactive to light.  Neck:    Cardiovascular: Normal rate.   Respiratory: Effort normal. No respiratory distress. She has no wheezes.  GI: Soft. She exhibits no distension. There is no tenderness.  Neurological: She is alert and oriented to person, place, and time.    Skin: Skin is warm.  Psychiatric: She has a normal mood and affect. Her behavior is normal. Judgment and thought content normal.     Assessment/Plan Excision of Neck lipoma  [name], DO 11/24/2015, 8:05 AM

## 2015-11-27 ENCOUNTER — Encounter (HOSPITAL_BASED_OUTPATIENT_CLINIC_OR_DEPARTMENT_OTHER): Payer: Self-pay | Admitting: Plastic Surgery

## 2016-05-30 ENCOUNTER — Encounter (INDEPENDENT_AMBULATORY_CARE_PROVIDER_SITE_OTHER): Payer: Self-pay | Admitting: Sports Medicine

## 2016-05-30 ENCOUNTER — Encounter (INDEPENDENT_AMBULATORY_CARE_PROVIDER_SITE_OTHER): Payer: Self-pay

## 2016-05-30 ENCOUNTER — Ambulatory Visit (INDEPENDENT_AMBULATORY_CARE_PROVIDER_SITE_OTHER): Payer: BLUE CROSS/BLUE SHIELD | Admitting: Sports Medicine

## 2016-05-30 ENCOUNTER — Ambulatory Visit (INDEPENDENT_AMBULATORY_CARE_PROVIDER_SITE_OTHER): Payer: BLUE CROSS/BLUE SHIELD | Admitting: Physical Medicine and Rehabilitation

## 2016-05-30 ENCOUNTER — Encounter (INDEPENDENT_AMBULATORY_CARE_PROVIDER_SITE_OTHER): Payer: Self-pay | Admitting: Physical Medicine and Rehabilitation

## 2016-05-30 ENCOUNTER — Ambulatory Visit (INDEPENDENT_AMBULATORY_CARE_PROVIDER_SITE_OTHER): Payer: Self-pay

## 2016-05-30 VITALS — BP 120/75 | HR 52 | Temp 98.1°F

## 2016-05-30 VITALS — BP 105/72 | HR 60 | Ht 64.0 in | Wt 185.0 lb

## 2016-05-30 DIAGNOSIS — M5416 Radiculopathy, lumbar region: Secondary | ICD-10-CM | POA: Diagnosis not present

## 2016-05-30 DIAGNOSIS — M5410 Radiculopathy, site unspecified: Secondary | ICD-10-CM

## 2016-05-30 MED ORDER — METHYLPREDNISOLONE ACETATE 80 MG/ML IJ SUSP
80.0000 mg | Freq: Once | INTRAMUSCULAR | Status: AC
  Administered 2016-05-30: 80 mg

## 2016-05-30 MED ORDER — GABAPENTIN 300 MG PO CAPS: ORAL_CAPSULE | ORAL | 1 refills | Status: DC

## 2016-05-30 MED ORDER — LIDOCAINE HCL (PF) 1 % IJ SOLN: 0.3300 mL | Freq: Once | INTRAMUSCULAR | Status: DC

## 2016-05-30 NOTE — Progress Notes (Signed)
Sherry Luna - 30 y.o. female MRN [id number]  Date of birth: 06-20-1985  Office Visit Note: Visit Date: 05/30/2016 PCP: Vidal Schwalbe, MD Referred by: [name], MD  Subjective: Chief Complaint  Patient presents with  . 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 Radiating down lateral side of right leg to foot. Some numbness and tingling when she swims. Pain pretty constant. Sharp shooting pain when gets up from seated position. Moving seems to make it better. She was seeing Dr. [name] today and he felt the severity of her symptoms she would likely benefit from epidural injection. She has not had an MRI of her lumbar spine. I did review x-rays with him personally.    ROS Otherwise per HPI.  Assessment & Plan: Visit Diagnoses:  1. Lumbar radiculopathy     Plan: Findings:  Right L5 and/or S1 radicular complaints likely from an L4-5 disc protrusion or herniation. This is from a clinical standpoint. I did complete diagnostic and therapeutic right L5-S1 intralaminar epidural steroid injection today due to the severity of her symptoms. She might require MRI of the lumbar spine limiting how much relief she gets.    Meds & Orders:  Meds ordered this encounter  Medications  . lidocaine (PF) (XYLOCAINE) 1 % injection 0.3 mL  . methylPREDNISolone acetate (DEPO-MEDROL) injection 80 mg    Orders Placed This Encounter  Procedures  . Epidural Steroid injection    Follow-up: Return for follow up with Dr. [name].   Procedures: No procedures performed  Lumbar Epidural Steroid Injection - Interlaminar Approach with Fluoroscopic Guidance  Patient: Sherry Luna      Date of Birth: 01-Mar-1986 MRN: [id number] PCP: Vidal Schwalbe, MD      Visit Date: 05/30/2016   Universal Protocol:    Date/Time: 12/18/179:55 AM  Consent Given By: the  patient  Position: PRONE  Additional Comments: Vital signs were monitored before and after the procedure. Patient was prepped and draped in the usual sterile fashion. The correct patient, procedure, and site was verified.   Injection Procedure Details:  Procedure Site One Meds Administered:  Meds ordered this encounter  Medications  . lidocaine (PF) (XYLOCAINE) 1 % injection 0.3 mL  . methylPREDNISolone acetate (DEPO-MEDROL) injection 80 mg     Laterality: Right  Location/Site:  L5-S1  Needle size: 20 G  Needle type: Tuohy  Needle Placement: Paramedian epidural  Findings:  -Contrast Used: 1 mL iohexol 180 mg iodine/mL   -Comments: Excellent flow of contrast into the epidural space.  Procedure Details: Using a paramedian approach from the side mentioned above, the region overlying the inferior lamina was localized under fluoroscopic visualization and the soft tissues overlying this structure were infiltrated with 4 ml. of 1% Lidocaine without Epinephrine. The Tuohy needle was inserted into the epidural space using a paramedian approach.   The epidural space was localized using loss of resistance along with lateral and bi-planar fluoroscopic views.  After negative aspirate for air, blood, and CSF, a 2 ml. volume of Isovue-250 was injected into the epidural space and the flow of contrast was observed. Radiographs were obtained for documentation purposes.    The injectate was administered into the level noted above.   Additional Comments:  The patient tolerated the procedure well Dressing: Band-Aid    Post-procedure details: Patient was observed during the procedure. Post-procedure instructions were reviewed.  Patient left the clinic in stable condition.    Clinical History: No specialty comments available.  She reports that she has never smoked. She has never used smokeless tobacco. No results for input(s): HGBA1C, LABURIC in the last 8760 hours.  Objective:   VS:  HT:    WT:   BMI:     BP:120/75  HR:(!) 52bpm  TEMP:98.1 F (36.7 C)(Oral)  RESP:100 % Physical Exam  Musculoskeletal:  Patient ambulates without aid with good distal strength.    Ortho Exam Imaging: No results found.  Past Medical/Family/Surgical/Social History: Medications & Allergies reviewed per EMR Patient Active Problem List   Diagnosis Date Noted  . Lipoma of neck 10/05/2015   History reviewed. No pertinent past medical history. History reviewed. No pertinent family history. Past Surgical History:  Procedure Laterality Date  . APPENDECTOMY    . CHOLECYSTECTOMY    . FRACTURE SURGERY    . LIPOMA EXCISION Right 11/26/2015   Procedure: EXCISION POSTERIOR NECK LIPOMA;  Surgeon: [name], DO;  Location: [facility];  Service: Plastics;  Laterality: Right;   Social History   Occupational History  . Not on file.   Social History Main Topics  . Smoking status: Never Smoker  . Smokeless tobacco: Never Used  . Alcohol use Yes     Comment: occas  . Drug use: No  . Sexual activity: Yes    Birth control/ protection: IUD

## 2016-05-30 NOTE — Progress Notes (Signed)
Sherry Luna - 30 y.o. female MRN [id number]  Date of birth: May 17, 1986  Office Visit Note: Visit Date: 05/30/2016 PCP: Vidal Schwalbe, MD Referred by: [name], MD  Subjective: Chief Complaint  Patient presents with  . Lower Back - New Patient (Initial Visit)  . Right Leg - New Patient (Initial Visit)  . Follow-up    Patient states right leg pain for about 1 month radiating into right foot.  Hurts to sit, numbness and tingling when swimming.  States activity makes it better. Stiffness in SI joint.   HPI: 2 months of worsening right sided low back and posterior buttock pain with radiation down to the lateral aspect of the ankle.  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  Symptoms are relieved with extension and anti-inflammatories and this is only short-lived.  Physical activity tends to increase her pain following the activity but does tend to loosen her up.  She will occasionally get sharp shooting pain that lasts for 10-15 seconds and gradually tapers off over the next 30-40 seconds especially while getting up out of a car. ROS: She denies any night sweats, fevers or chills.  No changes in bowel or bladder.  No saddle anesthesia.  No significant muscular weakness appreciated.  She is having some nighttime awakenings but also attributes this to having a new dog at home.  She has worked with physical therapy as well as with other massage therapist and has been having persistent ongoing symptoms in spite of these measures.. Otherwise per HPI.   Clinical History: No specialty comments available.  She reports that she has never smoked. She has never used smokeless tobacco.  No results for input(s): HGBA1C, LABURIC in the last 8760 hours.  Assessment & Plan: Visit Diagnoses:    ICD-9-CM ICD-10-CM   1. Back pain with right-sided  radiculopathy 724.4 M54.10 XR Lumbar Spine 2-3 Views     Ambulatory referral to Physical Medicine Rehab    Plan: We will set her up for an epidural steroid injection with Dr. Ernestina Patches.  Suspect L4-5 HNP with right-sided L5 radiculitis.  Discussed multiple options with her today including continued conservative management, diagnostic MRI versus diagnostic and therapeutic empiric epidural steroid.  Patient does wish to pursue empiric treatment.  Recommend avoiding significant physical activity over the next 24 to 48 hrs. then slowly began working back to normal activities avoiding any significant lifting squatting or twisting movements. I'll plan to follow-up with her by telephone.  If any persistent ongoing issues will obtain MRI of the lumbar spine. Follow-up: Return if symptoms worsen or fail to improve.  Meds:  Meds ordered this encounter  Medications  . gabapentin (NEURONTIN) 300 MG capsule    Sig: Start with 1 tab po qhs X 1 week, then increase to 1 tab po bid X 1 week then 1 tab po tid prn    Dispense:  90 capsule    Refill:  1   Procedures: No notes on file   Objective:  VS:  HT:5\' 4"  (162.6 cm)   WT:185 lb (83.9 kg)  BMI:31.8    BP:105/72  HR:60bpm  TEMP: ( )  RESP:  Physical Exam:  Adult female. Alert and appropriate.  In no acute distress.  Lower extremities are overall well aligned with no significant deformity. No significant swelling.  Distal pulses 2+/4. No significant bruising/ecchymosis or  erythema the skin BACK: Markedly tight and painful right straight leg raise to the posterior buttock.  Pain with popliteal compression test as well as palpation of the greater sciatic notch. Sensation intact to light touch in bilateral lower extremities No significant midline tenderness.  Bilateral paraspinal muscle spasms right worse than left Good internal and external rotation of the hips.  Minimally shortened left leg  Manual muscle testing is 5+/5 without focality Lower extremity  DTRs 2+/4 diffusely with port fatigability of the right Achilles.   Imaging: Xr Lumbar Spine 2-3 Views  Result Date: 05/30/2016 Findings: 2V lumbar spine: Overall well aligned.  Incomplete posterior arch consistent with spina bifida occulta at L5 level.  No acute fracture/dislocation.  Otherwise to space her well aligned.  No significant degenerative change. Impression: Spina bifida occulta otherwise normal x-ray   Past Medical/Family/Surgical/Social History: Medications & Allergies reviewed per EMR Patient Active Problem List   Diagnosis Date Noted  . Lipoma of neck 10/05/2015   No past medical history on file. No family history on file. Past Surgical History:  Procedure Laterality Date  . APPENDECTOMY    . CHOLECYSTECTOMY    . FRACTURE SURGERY    . LIPOMA EXCISION Right 11/26/2015   Procedure: EXCISION POSTERIOR NECK LIPOMA;  Surgeon: [name], DO;  Location: [facility];  Service: Plastics;  Laterality: Right;   Social History   Occupational History  . Not on file.   Social History Main Topics  . Smoking status: Never Smoker  . Smokeless tobacco: Never Used  . Alcohol use Yes     Comment: occas  . Drug use: No  . Sexual activity: Yes    Birth control/ protection: IUD

## 2016-05-30 NOTE — Procedures (Signed)
Lumbar Epidural Steroid Injection - Interlaminar Approach with Fluoroscopic Guidance  Patient: Sherry Luna      Date of Birth: 1985-07-08 MRN: [id number] PCP: Vidal Schwalbe, MD      Visit Date: 05/30/2016   Universal Protocol:    Date/Time: 12/18/179:55 AM  Consent Given By: the patient  Position: PRONE  Additional Comments: Vital signs were monitored before and after the procedure. Patient was prepped and draped in the usual sterile fashion. The correct patient, procedure, and site was verified.   Injection Procedure Details:  Procedure Site One Meds Administered:  Meds ordered this encounter  Medications  . lidocaine (PF) (XYLOCAINE) 1 % injection 0.3 mL  . methylPREDNISolone acetate (DEPO-MEDROL) injection 80 mg     Laterality: Right  Location/Site:  L5-S1  Needle size: 20 G  Needle type: Tuohy  Needle Placement: Paramedian epidural  Findings:  -Contrast Used: 1 mL iohexol 180 mg iodine/mL   -Comments: Excellent flow of contrast into the epidural space.  Procedure Details: Using a paramedian approach from the side mentioned above, the region overlying the inferior lamina was localized under fluoroscopic visualization and the soft tissues overlying this structure were infiltrated with 4 ml. of 1% Lidocaine without Epinephrine. The Tuohy needle was inserted into the epidural space using a paramedian approach.   The epidural space was localized using loss of resistance along with lateral and bi-planar fluoroscopic views.  After negative aspirate for air, blood, and CSF, a 2 ml. volume of Isovue-250 was injected into the epidural space and the flow of contrast was observed. Radiographs were obtained for documentation purposes.    The injectate was administered into the level noted above.   Additional Comments:  The patient tolerated the procedure well Dressing: Band-Aid    Post-procedure details: Patient was observed during the procedure. Post-procedure  instructions were reviewed.  Patient left the clinic in stable condition.

## 2016-05-30 NOTE — Patient Instructions (Signed)
I am transferring practices as of January 1st  to [facility] at [place].  This is a great opportunity & I am saddened to be leaving [facility] however & excited for new opportunities. I will continue to be seeing patients at [facility] through the end of December. I am happy to see you at the new location but also am confident that you are in great hands with the excellent providers here at [facility].  We are not currently scheduling patients at the new location at this time but if you look on [facility]'s website a contact information should be available there closer to January. Additionally [email or web address] will have information when it becomes available.    The telephone number will be [phone]  - Nobody will be answering this phone number until closer to January. 

## 2016-05-30 NOTE — Patient Instructions (Signed)

## 2016-06-09 ENCOUNTER — Encounter (INDEPENDENT_AMBULATORY_CARE_PROVIDER_SITE_OTHER): Payer: Self-pay | Admitting: Sports Medicine

## 2016-06-09 DIAGNOSIS — M5417 Radiculopathy, lumbosacral region: Secondary | ICD-10-CM

## 2016-06-21 ENCOUNTER — Telehealth (INDEPENDENT_AMBULATORY_CARE_PROVIDER_SITE_OTHER): Payer: Self-pay

## 2016-06-21 MED ORDER — TRAMADOL HCL 50 MG PO TABS: 50.0000 mg | ORAL_TABLET | Freq: Four times a day (QID) | ORAL | 0 refills | Status: DC | PRN

## 2016-06-21 NOTE — Telephone Encounter (Signed)
Pt has failed conservative measures including gabapentinopids, physican guided exercise program, emperic ESI and is having worsening symptoms at this time.  MRI lumbar spine indicated at this time.   Rx for Tramadol to be called into pharmacy.  Will call her with the results of the MRI

## 2016-06-21 NOTE — Telephone Encounter (Signed)
Talked with pharmacy and gave Tramadol Rx for patient.

## 2016-06-27 ENCOUNTER — Ambulatory Visit
Admission: RE | Admit: 2016-06-27 | Discharge: 2016-06-27 | Disposition: A | Discharge status: Home / Self Care | Payer: BLUE CROSS/BLUE SHIELD | Source: Ambulatory Visit | Attending: Sports Medicine | Admitting: Sports Medicine

## 2016-06-27 ENCOUNTER — Telehealth: Payer: Self-pay | Admitting: Sports Medicine

## 2016-06-27 DIAGNOSIS — M519 Unspecified thoracic, thoracolumbar and lumbosacral intervertebral disc disorder: Secondary | ICD-10-CM

## 2016-06-27 DIAGNOSIS — M5417 Radiculopathy, lumbosacral region: Secondary | ICD-10-CM

## 2016-06-27 NOTE — Telephone Encounter (Signed)
Epidural Reviewed with pt by phone. Will try repeat ESI with Dr. Ernestina Patches. Referal placed. Will need to f/u in 4-6 weeks by phone. Some smnolence reported with Gabapentin and Tramadol. Okay to refill as needed and will plan to have her titrate to symptoms.

## 2016-07-04 ENCOUNTER — Encounter (INDEPENDENT_AMBULATORY_CARE_PROVIDER_SITE_OTHER): Payer: Self-pay | Admitting: Physical Medicine and Rehabilitation

## 2016-07-04 ENCOUNTER — Ambulatory Visit (INDEPENDENT_AMBULATORY_CARE_PROVIDER_SITE_OTHER): Payer: BLUE CROSS/BLUE SHIELD | Admitting: Physical Medicine and Rehabilitation

## 2016-07-04 VITALS — BP 118/74 | HR 74 | Temp 98.1°F

## 2016-07-04 DIAGNOSIS — M5416 Radiculopathy, lumbar region: Secondary | ICD-10-CM | POA: Diagnosis not present

## 2016-07-04 MED ORDER — METHYLPREDNISOLONE ACETATE 80 MG/ML IJ SUSP
80.0000 mg | Freq: Once | INTRAMUSCULAR | Status: AC
  Administered 2016-07-04: 80 mg

## 2016-07-04 NOTE — Procedures (Signed)
Lumbosacral Transforaminal Epidural Steroid Injection - Infraneural Approach with Fluoroscopic Guidance  Patient: Sherry Luna      Date of Birth: [birth year] MRN: [id number] PCP: Vidal Schwalbe, MD      Visit Date: 07/04/2016   Universal Protocol:    Date/Time: 01/22/188:20 AM  Consent Given By: the patient  Position: PRONE   Additional Comments: Vital signs were monitored before and after the procedure. Patient was prepped and draped in the usual sterile fashion. The correct patient, procedure, and site was verified.   Injection Procedure Details:  Procedure Site One Meds Administered:  Meds ordered this encounter  Medications  . methylPREDNISolone acetate (DEPO-MEDROL) injection 80 mg      Laterality: Right  Location/Site:  S1-2  Needle size: 22 G  Needle type: Spinal  Needle Placement: Transforaminal  Findings:  -Contrast Used: 2 mL iohexol 180 mg iodine/mL   -Comments: Excellent flow of contrast along the nerve and into the epidural space.  Procedure Details: After squaring off the end-plates of the desired vertebral level to get a true AP view, the C-arm was obliqued to the painful side so that the superior articulating process is positioned about 1/3 the length of the inferior endplate.  The needle was aimed toward the junction of the superior articular process and the transverse process of the inferior vertebrae. The needle's initial entry is in the lower third of the foramen through Kambin's triangle. The soft tissues overlying this target were infiltrated with 2-3 ml. of 1% Lidocaine without Epinephrine.  The spinal needle was then inserted and advanced toward the target using a "trajectory" view along the fluoroscope beam.  Under AP and lateral visualization, the needle was advanced so it did not puncture dura and did not traverse medially beyond the 6 o'clock position of the pedicle. Bi-planar projections were used to confirm position. Aspiration was confirmed  to be negative for CSF and/or blood. A 1-2 ml. volume of Isovue-250 was injected and flow of contrast was noted at each level. Radiographs were obtained for documentation purposes.   After attaining the desired flow of contrast documented above, a 0.5 to 1.0 ml test dose of 0.25% Marcaine was injected into each respective transforaminal space.  The patient was observed for 90 seconds post injection.  After no sensory deficits were reported, and normal lower extremity motor function was noted,   the above injectate was administered so that equal amounts of the injectate were placed at each foramen (level) into the transforaminal epidural space.   Additional Comments:  The patient tolerated the procedure well Dressing: Band-Aid    Post-procedure details: Patient was observed during the procedure. Post-procedure instructions were reviewed.  Patient left the clinic in stable condition.

## 2016-07-04 NOTE — Patient Instructions (Signed)

## 2016-07-04 NOTE — Progress Notes (Signed)
Sherry Luna - 31 y.o. female MRN [id number]  Date of birth: 04-23-1986  Office Visit Note: Visit Date: 07/04/2016 PCP: Vidal Schwalbe, MD Referred by: [name], MD  Subjective: Chief Complaint  Patient presents with  . Right Leg - Pain   HPI: [name] is a 31 year old female with right S1 radicular type pain which is quite severe. She describes this as a fiery pain down the right leg with numbness and tingling. It is most noticeable when swimming. Prolonged sitting is problematic. We completed an intralaminar epidural steroid injection that gave her some relief but was very short-lived. Since I've seen her she has had an MRI of the lumbar spine showing a right paracentral disc herniation at L5-S1 with impacting the S1 nerve root. This is moderate size.    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  Assessment & Plan: Visit Diagnoses:  1. Lumbar radiculopathy     Plan: Findings:  Right S1 transforaminal epidural steroid injection. Depending on the relief we might repeat this 1. She is also to follow-up with Dr. [name] by phone in a few weeks.    Meds & Orders:  Meds ordered this encounter  Medications  . methylPREDNISolone acetate (DEPO-MEDROL) injection 80 mg    Orders Placed This Encounter  Procedures  . Epidural Steroid injection    Follow-up: Return if symptoms worsen or fail to improve, will cal Dr. [name].   Procedures: No procedures performed  Lumbosacral Transforaminal Epidural Steroid Injection - Infraneural Approach with Fluoroscopic Guidance  Patient: Sherry Luna      Date of Birth: February 04, 1986 MRN: [id number] PCP: Vidal Schwalbe, MD      Visit Date: 07/04/2016   Universal Protocol:    Date/Time: 01/22/188:20 AM  Consent Given By: the patient  Position: PRONE   Additional Comments: Vital signs were monitored before and after the procedure. Patient was prepped and draped in the usual sterile fashion. The correct patient, procedure, and site  was verified.   Injection Procedure Details:  Procedure Site One Meds Administered:  Meds ordered this encounter  Medications  . methylPREDNISolone acetate (DEPO-MEDROL) injection 80 mg      Laterality: Right  Location/Site:  S1-2  Needle size: 22 G  Needle type: Spinal  Needle Placement: Transforaminal  Findings:  -Contrast Used: 2 mL iohexol 180 mg iodine/mL   -Comments: Excellent flow of contrast along the nerve and into the epidural space.  Procedure Details: After squaring off the end-plates of the desired vertebral level to get a true AP view, the C-arm was obliqued to the painful side so that the superior articulating process is positioned about 1/3 the length of the inferior endplate.  The needle was aimed toward the junction of the superior articular process and the transverse process of the inferior vertebrae. The needle's initial entry is in the lower third of the foramen through Kambin's triangle. The soft tissues overlying this target were infiltrated with 2-3 ml. of 1% Lidocaine without Epinephrine.  The spinal needle was then inserted and advanced toward the target using a "trajectory" view along the fluoroscope beam.  Under AP and lateral visualization, the needle was advanced so it did not puncture dura and did not traverse medially beyond the 6 o'clock position of the pedicle. Bi-planar projections were used to confirm position. Aspiration was confirmed to be negative for CSF and/or blood. A 1-2 ml. volume of Isovue-250 was injected and flow of contrast was noted at each level. Radiographs were obtained for documentation purposes.  After attaining the desired flow of contrast documented above, a 0.5 to 1.0 ml test dose of 0.25% Marcaine was injected into each respective transforaminal space.  The patient was observed for 90 seconds post injection.  After no sensory deficits were reported, and normal lower extremity motor function was noted,   the above injectate was  administered so that equal amounts of the injectate were placed at each foramen (level) into the transforaminal epidural space.   Additional Comments:  The patient tolerated the procedure well Dressing: Band-Aid    Post-procedure details: Patient was observed during the procedure. Post-procedure instructions were reviewed.  Patient left the clinic in stable condition.   Clinical History: Lumbar spine MRI 06/27/2016  L3-L4: No significant disc bulge. Mild bilateral facet arthropathy. No evidence of neural foraminal stenosis. No central canal stenosis.  L4-L5: No significant disc bulge. Mild bilateral facet arthropathy. No evidence of neural foraminal stenosis. No central canal stenosis.  L5-S1: Moderate-sized right paracentral disc protrusion with mass effect on the right intraspinal S1 nerve root. Mild left facet arthropathy. No evidence of neural foraminal stenosis. No central canal stenosis.  IMPRESSION: 1. At L5-S1 there is a moderate-sized right paracentral disc protrusion with mass effect on the right intraspinal S1 nerve root.  She reports that she has never smoked. She has never used smokeless tobacco. No results for input(s): HGBA1C, LABURIC in the last 8760 hours.  Objective:  VS:  HT:    WT:   BMI:     BP:118/74  HR:74bpm  TEMP:98.1 F (36.7 C)(Oral)  RESP:100 % Physical Exam  Musculoskeletal:  She ambulates without aid with a normal gait. Somewhat antalgic to the right.    Ortho Exam Imaging: No results found.  Past Medical/Family/Surgical/Social History: Medications & Allergies reviewed per EMR Patient Active Problem List   Diagnosis Date Noted  . Lipoma of neck 10/05/2015   History reviewed. No pertinent past medical history. History reviewed. No pertinent family history. Past Surgical History:  Procedure Laterality Date  . APPENDECTOMY    . CHOLECYSTECTOMY    . FRACTURE SURGERY    . LIPOMA EXCISION Right 11/26/2015   Procedure: EXCISION  POSTERIOR NECK LIPOMA;  Surgeon: [name], DO;  Location: [place];  Service: Plastics;  Laterality: Right;   Social History   Occupational History  . Not on file.   Social History Main Topics  . Smoking status: Never Smoker  . Smokeless tobacco: Never Used  . Alcohol use Yes     Comment: occas  . Drug use: No  . Sexual activity: Yes    Birth control/ protection: IUD

## 2016-07-07 MED ORDER — GABAPENTIN 300 MG PO CAPS: 300.0000 mg | ORAL_CAPSULE | Freq: Three times a day (TID) | ORAL | 2 refills | Status: DC

## 2016-07-11 ENCOUNTER — Encounter: Payer: Self-pay | Admitting: Sports Medicine

## 2016-07-11 ENCOUNTER — Ambulatory Visit (INDEPENDENT_AMBULATORY_CARE_PROVIDER_SITE_OTHER): Payer: BLUE CROSS/BLUE SHIELD | Admitting: Sports Medicine

## 2016-07-11 VITALS — BP 100/76 | HR 100 | Ht 65.0 in | Wt 197.4 lb

## 2016-07-11 DIAGNOSIS — M519 Unspecified thoracic, thoracolumbar and lumbosacral intervertebral disc disorder: Secondary | ICD-10-CM | POA: Diagnosis not present

## 2016-07-11 MED ORDER — PREGABALIN 75 MG PO CAPS: 75.0000 mg | ORAL_CAPSULE | Freq: Two times a day (BID) | ORAL | 1 refills | Status: DC

## 2016-07-11 NOTE — Progress Notes (Signed)
Sherry Luna - 31 y.o. female MRN [id number]  Date of birth: [birth year]  Office Visit Note: Visit Date: 07/11/2016 PCP: Vidal Schwalbe, MD Referred by: [name], MD  Subjective: Chief Complaint  Patient presents with  . Follow-up    Pt c/o pain down leg into her foot. Pain started in 03/2016, she has been seen by Dr. [name] for this in the past.    HPI: Patient presents for reevaluation after her second epidural steroid injection most recently on 07/04/2016.  She is a large right-sided L5-S1 herniated disc.  She is now having issues with pain rated at 91-2 times per day.  She is having significant disturbances in her sleep and is usually only getting 4 hours per night.  She did have moderate relief with gabapentin as well as tramadol but has had to cut back on this as she felt she was becoming dependent.  She had only minimal control of her symptoms while on this.  She does report some minimal saddle dysesthesia that has been progressive over the past several days.  She denies any bowel or bladder incontinence.  She is noted only minimal weakness and is able to continue to heel and toe walk at this time. ROS: Otherwise per HPI.   Clinical History: No specialty comments available.  She reports that she has never smoked. She has never used smokeless tobacco.  No results for input(s): HGBA1C, LABURIC in the last 8760 hours.  Assessment & Plan: Visit Diagnoses:    ICD-9-CM ICD-10-CM   1. Intervertebral disk disease 722.90 M51.9 Ambulatory referral to Neurosurgery    Plan: Unfortunately this is not been responsive to conservative management including physical therapy, epidural steroid injections in independent.  Surgical intervention is likely warranted and I'll place a referral to [place] neurosurgery for consideration of microdiscectomy.  Will try to switch to Lyrica for hopefully less sedation and school excuse provided. >50% of this 25 minute visit spent in direct patient counseling  and/or coordination of care. Discussion was focused on education regarding the in discussing the pathoetiology and anticipated clinical course of the above condition.  focus our discussion was around surgical versus nonsurgical outcomes and given the severity of her pain and progressive nature of her pain, surgical intervention is appropriate.   Follow-up: Return if symptoms worsen or fail to improve.  Meds:  Meds ordered this encounter  Medications  . pregabalin (LYRICA) 75 MG capsule    Sig: Take 1 capsule (75 mg total) by mouth 2 (two) times daily.    Dispense:  60 capsule    Refill:  1   Procedures: No notes on file   Objective:  VS:  HT:5\' 5"  (165.1 cm)   WT:197 lb 6.4 oz (89.5 kg)  BMI:32.9    BP:100/76  HR:100bpm  TEMP: ( )  RESP:99 % Physical Exam:  Adult female.  No acute distress.  Stands throughout the exam due to discomfort with sitting.  She is markedly positive straight leg raise on the right.  Minimally positive contralateral straight leg raise on the left.  She is able to heel and toe walk without difficulty.  Lower extremity sensation is diminished in the L5 and S1 distributions on the right.  Normal on the left.  Lower extremity reflexes are symmetric. Imaging: Mr Lumbar Spine Wo Contrast  Result Date: 06/27/2016 CLINICAL DATA:  Right sciatic pain, thigh pain, foot pain and tingling in the right lateral thigh since October 2017. EXAM: MRI LUMBAR SPINE WITHOUT CONTRAST TECHNIQUE: Multiplanar, multisequence  MR imaging of the lumbar spine was performed. No intravenous contrast was administered. COMPARISON:  None. FINDINGS: Segmentation:  Standard. Alignment:  Physiologic. Vertebrae:  No fracture, evidence of discitis, or bone lesion. Conus medullaris: Extends to the T12 level and appears normal. Paraspinal and other soft tissues: No paraspinal soft tissue abnormality. Disc levels: Disc spaces: Degenerative disc disease with disc desiccation at L4-5. Degenerative disc disease  with disc desiccation and minimal disc height loss at L5-S1. T12-L1: No significant disc bulge. No evidence of neural foraminal stenosis. No central canal stenosis. L1-L2: No significant disc bulge. No evidence of neural foraminal stenosis. No central canal stenosis. L2-L3: No significant disc bulge. No evidence of neural foraminal stenosis. No central canal stenosis. L3-L4: No significant disc bulge. Mild bilateral facet arthropathy. No evidence of neural foraminal stenosis. No central canal stenosis. L4-L5: No significant disc bulge. Mild bilateral facet arthropathy. No evidence of neural foraminal stenosis. No central canal stenosis. L5-S1: Moderate-sized right paracentral disc protrusion with mass effect on the right intraspinal S1 nerve root. Mild left facet arthropathy. No evidence of neural foraminal stenosis. No central canal stenosis. IMPRESSION: 1. At L5-S1 there is a moderate-sized right paracentral disc protrusion with mass effect on the right intraspinal S1 nerve root. Electronically Signed   By: Kathreen Devoid   On: 06/27/2016 08:09    Past Medical/Family/Surgical/Social History: Medications & Allergies reviewed per EMR Patient Active Problem List   Diagnosis Date Noted  . Intervertebral disk disease 07/11/2016  . Lipoma of neck 10/05/2015   No past medical history on file. No family history on file. Past Surgical History:  Procedure Laterality Date  . APPENDECTOMY    . CHOLECYSTECTOMY    . FRACTURE SURGERY    . LIPOMA EXCISION Right 11/26/2015   Procedure: EXCISION POSTERIOR NECK LIPOMA;  Surgeon: [name], DO;  Location: [place];  Service: Plastics;  Laterality: Right;   Social History   Occupational History  . Not on file.   Social History Main Topics  . Smoking status: Never Smoker  . Smokeless tobacco: Never Used  . Alcohol use Yes     Comment: occas  . Drug use: No  . Sexual activity: Yes    Birth control/ protection: IUD

## 2016-07-11 NOTE — Progress Notes (Signed)
Pre visit review using our clinic review tool, if applicable. No additional management support is needed unless otherwise documented below in the visit note. 

## 2016-07-12 ENCOUNTER — Telehealth: Payer: Self-pay

## 2016-07-12 NOTE — Telephone Encounter (Signed)
Lyrica has been approved through insurance and will be ready to pick up at pharmacy today. Called pt and LMOM to call the office.

## 2016-07-12 NOTE — Telephone Encounter (Signed)
Pt returned called and was advised.

## 2016-07-13 ENCOUNTER — Encounter: Payer: Self-pay | Admitting: Sports Medicine

## 2016-07-13 ENCOUNTER — Telehealth: Payer: Self-pay | Admitting: Sports Medicine

## 2016-07-13 NOTE — Telephone Encounter (Signed)
Paperwork received from [facility] for patient. Successfully sent to be scanned.

## 2016-08-29 ENCOUNTER — Telehealth: Payer: Self-pay | Admitting: Sports Medicine

## 2016-08-29 NOTE — Telephone Encounter (Signed)
Patient is calling due to ongoing concerns following her L5 discectomy on Feb 6th. 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  She has had some return of symptoms including worsening stiffness first thing in the morning as well as some residual leg symptoms but reports these only being minimal at this time.  She is taking ibuprofen and Tylenol.    Regarding physical therapy and activities Dr. none to car neurosurgeries recommend avoiding specific exercises for the first 3 months and I agree.  Gentle range of motion and gentle functional range stretching as well as heat, ice are appropriate.  Additionally I did recommend that she consider looking into using a lumbar support while performing massage only that she should not use this at any other time.  If any worsening ongoing symptoms or any worsening leg pain patient will need follow-up with neurosurgery.

## 2016-08-29 NOTE — Telephone Encounter (Signed)
Please call patient concerning pain following back surgery L5 disc ectomy. Okay to leave detailed message on patient's phone.

## 2016-09-01 ENCOUNTER — Encounter: Payer: Self-pay | Admitting: Sports Medicine

## 2016-09-16 ENCOUNTER — Encounter: Payer: Self-pay | Admitting: Sports Medicine

## 2016-09-29 ENCOUNTER — Encounter: Payer: Self-pay | Admitting: Sports Medicine

## 2016-09-29 ENCOUNTER — Ambulatory Visit (INDEPENDENT_AMBULATORY_CARE_PROVIDER_SITE_OTHER): Payer: 59 | Admitting: Sports Medicine

## 2016-09-29 VITALS — BP 115/78 | HR 60 | Ht 64.75 in | Wt 193.4 lb

## 2016-09-29 DIAGNOSIS — M5440 Lumbago with sciatica, unspecified side: Secondary | ICD-10-CM

## 2016-09-29 DIAGNOSIS — M519 Unspecified thoracic, thoracolumbar and lumbosacral intervertebral disc disorder: Secondary | ICD-10-CM | POA: Diagnosis not present

## 2016-09-29 DIAGNOSIS — M9904 Segmental and somatic dysfunction of sacral region: Secondary | ICD-10-CM | POA: Diagnosis not present

## 2016-09-29 DIAGNOSIS — M9906 Segmental and somatic dysfunction of lower extremity: Secondary | ICD-10-CM

## 2016-09-29 DIAGNOSIS — G8929 Other chronic pain: Secondary | ICD-10-CM | POA: Diagnosis not present

## 2016-09-29 DIAGNOSIS — M9905 Segmental and somatic dysfunction of pelvic region: Secondary | ICD-10-CM

## 2016-09-29 MED ORDER — PREGABALIN 75 MG PO CAPS: 75.0000 mg | ORAL_CAPSULE | Freq: Two times a day (BID) | ORAL | 2 refills | Status: DC

## 2016-09-29 NOTE — Assessment & Plan Note (Signed)
Status post L5 microdiscectomy with Dr. 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 Underlying structural issues with the psoas and hamstring are also contributing and her addressed today with gentle osteopathic manipulation.  She had some improvement with this. We will change her gabapentinoids to Lyrica to see if this is more effective and longer lasting. See no indication for a recurrent MRI at this time given lack of lower extremity dysesthesia or weakness.  Reflexes are normal. Therapeutic exercises emphasizing hamstring flexibility and hip flexor stretching.

## 2016-09-29 NOTE — Patient Instructions (Signed)
Also check out the YouTube Video from Dr. Eric Goodman.   "Powerful Posture and Pain Relief: 12 minutes of Foundation Training" - [email or web address]://[email or web address].[email or web address]   

## 2016-09-29 NOTE — Assessment & Plan Note (Signed)
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 Gentle resumption of activities as appropriate at this time given she is 3 months out. Okay to resume cycling and emphasized importance of hip hinging exercises. Follow-up in 4 weeks for consideration of repeat osteopathic manipulation

## 2016-09-29 NOTE — Progress Notes (Signed)
OFFICE VISIT NOTE Sherry Luna. Sherry Luna, [place] at [facility] [phone]  Sherry Luna - 31 y.o. female MRN [id number]  Date of birth: [birth year]  Visit Date: 09/29/2016  PCP: Vidal Schwalbe, MD   Referred by: [name], MD  [name]. [name], ATC acting as scribe for Dr. [name].  SUBJECTIVE:   Chief Complaint  Patient presents with  . pain in lumbar spine   Lumber spine pain with major pain radiating away into Right glute area and into lower leg/feet. Surgery 07/19/2016 (L5-S1 microdisectomy) Herniated disc. Chronic degenerative disc issues in lumbar area. 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  Worsened with lying prone, bending, twisting, lifting, (any HS activation),  Improves with core strengthening, rest Therapies tried include medications, PT (transverse ab stability), heat  Other associated symptoms include: None  Otherwise ROS as it pertains to the Chief Complaint is as below:  Pt denies any change in bowel or bladder habits, muscle weakness, numbness or falls associated with this pain. Denies fevers, chills, recent weight gain or weight loss.  No night sweats. No significant nighttime awakenings due to this issue. 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  Gastrointestinal: Negative for blood in stool, constipation, diarrhea and melena.  Genitourinary: Negative for dysuria and frequency.  Musculoskeletal: Positive for back pain. Negative for falls.    Otherwise per HPI.  HISTORY & PERTINENT PRIOR DATA:  No specialty comments available. She reports that she has never smoked. She has never used  smokeless tobacco. No results for input(s): HGBA1C, LABURIC in the last 8760 hours. Medications & Allergies reviewed per EMR Patient Active Problem List   Diagnosis Date Noted  . Chronic right-sided low back pain with sciatica 09/29/2016  . Intervertebral disk disease 07/11/2016  . Lipoma of neck 10/05/2015   No past medical history on file. No family history on file. Past Surgical History:  Procedure Laterality Date  . APPENDECTOMY    . CHOLECYSTECTOMY    . FRACTURE SURGERY    . LIPOMA EXCISION Right 11/26/2015   Procedure: EXCISION POSTERIOR NECK LIPOMA;  Surgeon: [name], DO;  Location: [place];  Service: Plastics;  Laterality: Right;  . LUMBAR MICRODISCECTOMY Right    Dr. [name]   Social History   Occupational History  . Not on file.   Social History Main Topics  . Smoking status: Never Smoker  . Smokeless tobacco: Never Used  . Alcohol use Yes     Comment: occas  . Drug use: No  . Sexual activity: Yes    Birth control/ protection: IUD    OBJECTIVE:  VS:  HT:5' 4.75" (164.5 cm)   WT:193 lb 6.4 oz (87.7 kg)  BMI:32.5    BP:115/78  HR:60bpm  TEMP: ( )  RESP:99 % Physical Exam  Constitutional: She appears well-developed and well-nourished. She is cooperative.  Non-toxic appearance.  HENT:  Head: Normocephalic and atraumatic.  Cardiovascular: Intact distal pulses.   Pulmonary/Chest: No accessory muscle usage. No respiratory distress.  Neurological: She is alert. She is not disoriented. She displays normal reflexes. No sensory deficit.  Skin: Skin is  warm, dry and intact. Capillary refill takes less than 2 seconds. No abrasion and no rash noted.  Psychiatric: She has a normal mood and affect. Her speech is normal and behavior is normal. Thought content normal.     Back: Well-healed postsurgical incision.  No other skin changes. Tightness with right straight leg raise but no positive slump sign or straight leg raise testing  radiating to the lumbar spine.  Lower extremity sensation is intact to light touch.  Lower extremity reflexes are normal.  Lower extremity strength in all myotomes is symmetric. She is markedly tight right hamstring as well as right iliopsoas muscle.  OSTEOPATHIC/STRUCTURAL EXAM:    Right anterior innominate  Left on left sacral torsion  Externally rotated and flexed right hip  IMAGING & PROCEDURES: No results found. No additional findings.   PROCEDURE NOTE : OSTEOPATHIC MANIPULATION The decision today to treat with Osteopathic Manipulative Therapy (OMT) was based on physical exam findings. Verbal consent was obtained after after explanation of risks, benefits and potential side effects, including acute pain flare, post manipulation soreness and need for repeat treatments.  If Cervical manipulation was performed additional time was spent discussing the associated minimal risk of  injury to neurovascular structures.  After consent was obtained manipulation was performed as below:            Regions treated:  Per billing codes          Techniques used:  Muscle Energy, MFR, FPR, HVLA (long lever) and CS  The patient tolerated the treatment well and reported Improved symptoms following treatment today. Patient was given medications, exercises, stretches and lifestyle modifications per AVS and verbally.     ASSESSMENT & PLAN:  Visit Diagnoses:  1. Chronic right-sided low back pain with sciatica, sciatica laterality unspecified   2. Intervertebral disk disease   3. Somatic dysfunction of sacral region   4. Somatic dysfunction of pelvis region   5. Somatic dysfunction of lower extremities    Meds:  Meds ordered this encounter  Medications  . DISCONTD: pregabalin (LYRICA) 75 MG capsule    Sig: Take 1 capsule (75 mg total) by mouth 2 (two) times daily.    Dispense:  60 capsule    Refill:  2  . pregabalin (LYRICA) 75 MG capsule    Sig: Take 1 capsule (75 mg total) by mouth 2 (two) times  daily.    Dispense:  60 capsule    Refill:  2    Orders: No orders of the defined types were placed in this encounter.   Follow-up: Return in about 4 weeks (around 10/27/2016).  Otherwise please see problem oriented charting as below.  CMA/ATC served as Education administrator during this visit. History, Physical, and Plan performed by medical provider. Documentation and orders reviewed and attested to.      Teresa Coombs, [place] [facility] Physician    09/29/2016 1:13 PM

## 2016-10-31 ENCOUNTER — Encounter: Payer: Self-pay | Admitting: Sports Medicine

## 2016-10-31 ENCOUNTER — Ambulatory Visit (INDEPENDENT_AMBULATORY_CARE_PROVIDER_SITE_OTHER): Payer: 59 | Admitting: Sports Medicine

## 2016-10-31 VITALS — BP 104/72 | HR 75 | Ht 64.75 in | Wt 200.4 lb

## 2016-10-31 DIAGNOSIS — M519 Unspecified thoracic, thoracolumbar and lumbosacral intervertebral disc disorder: Secondary | ICD-10-CM | POA: Diagnosis not present

## 2016-10-31 DIAGNOSIS — M9905 Segmental and somatic dysfunction of pelvic region: Secondary | ICD-10-CM

## 2016-10-31 DIAGNOSIS — M9904 Segmental and somatic dysfunction of sacral region: Secondary | ICD-10-CM | POA: Diagnosis not present

## 2016-10-31 DIAGNOSIS — G8929 Other chronic pain: Secondary | ICD-10-CM | POA: Diagnosis not present

## 2016-10-31 DIAGNOSIS — M79604 Pain in right leg: Secondary | ICD-10-CM | POA: Diagnosis not present

## 2016-10-31 DIAGNOSIS — M9906 Segmental and somatic dysfunction of lower extremity: Secondary | ICD-10-CM

## 2016-10-31 DIAGNOSIS — M9903 Segmental and somatic dysfunction of lumbar region: Secondary | ICD-10-CM

## 2016-10-31 DIAGNOSIS — M5440 Lumbago with sciatica, unspecified side: Secondary | ICD-10-CM

## 2016-10-31 MED ORDER — PREGABALIN 75 MG PO CAPS: 75.0000 mg | ORAL_CAPSULE | Freq: Two times a day (BID) | ORAL | 2 refills | Status: DC

## 2016-10-31 NOTE — Progress Notes (Signed)
OFFICE VISIT NOTE Sherry Luna. [place], [place] at [place]  NAPHTALI Luna - 31 y.o. female MRN [id number]  Date of birth: 1986/03/18  Visit Date: 10/31/2016  PCP: [name], MD   Referred by: [name], MD  [name], CMA acting as scribe for Dr. [name].  SUBJECTIVE:   Chief Complaint  Patient presents with  . Follow-up    low back pain   HPI: As below and per problem based documentation when appropriate.  Pt presents today for follow-up of low back pain. Pt c/o continues stiffness in lower back. It does get a little better every week but its been a slow process. She is exercising and has started cycling again. She is not in PT but she is doing exercises prescribed by PT. She is taking Tylenol every 6 hours for the pain/stiffness. She reports that high activity and long periods of standing make the pain worse. She has been trying to avoid lifting too much, this doesn't seem to make the pain any worse. She reports that cycling seems to help with the pain/stiffness.   Pt denies fever, chills, night sweats.     Review of Systems  Constitutional: Negative.   HENT: Negative.   Eyes: Negative.   Respiratory: Negative.   Cardiovascular: Negative.   Gastrointestinal: Negative.   Genitourinary: Negative.   Musculoskeletal: Positive for back pain. Negative for falls.  Skin: Negative.   Neurological: Negative.   Endo/Heme/Allergies: Bruises/bleeds easily.  Psychiatric/Behavioral: Negative.     Otherwise per HPI.  HISTORY & PERTINENT PRIOR DATA:  No specialty comments available. She reports that she has never smoked. She has never used smokeless tobacco. No results for input(s): HGBA1C, LABURIC in the last 8760 hours. Medications & Allergies reviewed per EMR Patient Active Problem List   Diagnosis Date Noted  . Right leg pain 10/31/2016  . Chronic right-sided low back pain with sciatica 09/29/2016  .  Intervertebral disk disease 07/11/2016  . Lipoma of neck 10/05/2015   No past medical history on file. No family history on file. Past Surgical History:  Procedure Laterality Date  . APPENDECTOMY    . CHOLECYSTECTOMY    . FRACTURE SURGERY    . LIPOMA EXCISION Right 11/26/2015   Procedure: EXCISION POSTERIOR NECK LIPOMA;  Surgeon: [name], DO;  Location: [facility];  Service: Plastics;  Laterality: Right;  . LUMBAR MICRODISCECTOMY Right    Dr. [name]   Social History   Occupational History  . Not on file.   Social History Main Topics  . Smoking status: Never Smoker  . Smokeless tobacco: Never Used  . Alcohol use Yes     Comment: occas  . Drug use: No  . Sexual activity: Yes    Birth control/ protection: IUD    OBJECTIVE:  VS:  HT:5' 4.75" (164.5 cm)   WT:200 lb 6.4 oz (90.9 kg)  BMI:33.7    BP:104/72  HR:75bpm  TEMP: ( )  RESP:99 % EXAM: Findings:  WDWN, NAD, Non-toxic appearing Alert & appropriately interactive Not depressed or anxious appearing No increased work of breathing. Pupils are equal. EOM intact without nystagmus No clubbing or cyanosis of the extremities appreciated No significant rashes/lesions/ulcerations overlying the examined area. DP & PT pulses 2+/4.  No significant pretibial edema.  No clubbing or cyanosis Sensation intact to light touch in lower extremities.  Bilateral lower extremities: Overall well aligned. Negative straight leg raise today. Lower extremity strength is  5 out of 5 in bilateral myotomes.  OSTEOPATHIC/STRUCTURAL EXAM:   Right anterior innominate  left on right sacral torsion L5 FRS left L3 FRS right Right anterior fibular head      No results found. ASSESSMENT & PLAN:   Problem List Items Addressed This Visit    Intervertebral disk disease    Overall she seems to be doing fairly well with this.  Radicular symptoms but she does describe a persistent right sided lateral leg pain with  riding that she was having prior to the surgery that is persistent when she has started resuming activities.  We will have her continue with therapeutic exercises previously outlined including foundations exercises from Minerva Ends. For her left on right sacral torsion cranial cranial information provided for her to try to see if this can provide some relief of the SI joint.  I would like her to come back for bike fit analysis as well for the I would like for her to come back for a bike fit analysis to reevaluate the right lateral leg pain that is likely coming from excessive supination as well as a dynamic genu valgus while riding based on musculoskeletal findings today.  She will likely benefit from IGLI insoles to help with the pain and discomfort she is having.      Chronic right-sided low back pain with sciatica    Functional low back pain at this time.  No evidence of persistent radicular symptoms.  She is status post prior L5 microdiscectomy and has been doing well since this time.  We will have her add in a B complex vitamin to see if this can help with nerve healing.  Otherwise osteopathic manipulation indirect as well as long lever was performed to this area today.  She had good improvement in her symptoms with this.  PROCEDURE NOTE : OSTEOPATHIC MANIPULATION The decision today to treat with Osteopathic Manipulative Therapy (OMT) was based on physical exam findings. Verbal consent was obtained after after explanation of risks, benefits and potential side effects, including acute pain flare, post manipulation soreness and need for repeat treatments.  If Cervical manipulation was performed additional time was spent discussing the associated minimal risk of  injury to neurovascular structures.  After consent was obtained manipulation was performed as below:            Regions treated:  Per billing codes          Techniques used:  Direct, Muscle Energy, Indirect and HVLA (long lever) The  patient tolerated the treatment well and reported Improved symptoms following treatment today. Patient was given medications, exercises, stretches and lifestyle modifications per AVS and verbally.         Relevant Medications   acetaminophen (TYLENOL) 325 MG tablet   pregabalin (LYRICA) 75 MG capsule   Right leg pain - Primary    Other Visit Diagnoses    Somatic dysfunction of lumbar region       Somatic dysfunction of pelvis region       Somatic dysfunction of lower extremities       Somatic dysfunction of sacral region          Follow-up: Return in about 4 days (around 11/04/2016) for for bike fit analysis and orthotics.   CMA/ATC served as Education administrator during this visit. History, Physical, and Plan performed by medical provider. Documentation and orders reviewed and attested to.      Teresa Coombs, [facility] Physician

## 2016-10-31 NOTE — Assessment & Plan Note (Signed)
Overall she seems to be doing fairly well with this.  Radicular symptoms but she does describe a persistent right sided lateral leg pain with riding that she was having prior to the surgery that is persistent when she has started resuming activities.  We will have her continue with therapeutic exercises previously outlined including foundations exercises from [employer]erva Ends. For her left on right sacral torsion cranial cranial information provided for her to try to see if this can provide some relief of the SI joint.  I would like her to come back for bike fit analysis as well for the I would like for her to come back for a bike fit analysis to reevaluate the right lateral leg pain that is likely coming from excessive supination as well as a dynamic genu valgus while riding based on musculoskeletal findings today.  She will likely benefit from IGLI insoles to help with the pain and discomfort she is having.

## 2016-10-31 NOTE — Assessment & Plan Note (Signed)
Functional low back pain at this time.  No evidence of persistent radicular symptoms.  She is status post prior L5 microdiscectomy and has been doing well since this time.  We will have her add in a B complex vitamin to see if this can help with nerve healing.  Otherwise osteopathic manipulation indirect as well as long lever was performed to this area today.  She had good improvement in her symptoms with this.  PROCEDURE NOTE : OSTEOPATHIC MANIPULATION The decision today to treat with Osteopathic Manipulative Therapy (OMT) was based on physical exam findings. Verbal consent was obtained after after explanation of risks, benefits and potential side effects, including acute pain flare, post manipulation soreness and need for repeat treatments.  If Cervical manipulation was performed additional time was spent discussing the associated minimal risk of  injury to neurovascular structures.  After consent was obtained manipulation was performed as below:            Regions treated:  Per billing codes          Techniques used:  Direct, Muscle Energy, Indirect and HVLA (long lever) The patient tolerated the treatment well and reported Improved symptoms following treatment today. Patient was given medications, exercises, stretches and lifestyle modifications per AVS and verbally.

## 2016-10-31 NOTE — Patient Instructions (Signed)
Try adding a B complex vitamin. Continue with "Foundation's" workouts.  Look into getting his DVD.  We gave you the information regarding the Craniocradle.  This may be beneficial for you to help with your low back going forward.   Look into having your insurance company cover a set of custom orthotics.  The code is L3030 and there are 2 units.  You can call them  and ask if this is covered.  I am happy to do these for you at any time, you just need to let our front office schedulers know you would like an "orthotic appointment."

## 2016-11-03 DIAGNOSIS — H5203 Hypermetropia, bilateral: Secondary | ICD-10-CM | POA: Diagnosis not present

## 2016-11-03 DIAGNOSIS — H52223 Regular astigmatism, bilateral: Secondary | ICD-10-CM | POA: Diagnosis not present

## 2016-11-03 DIAGNOSIS — H04123 Dry eye syndrome of bilateral lacrimal glands: Secondary | ICD-10-CM | POA: Diagnosis not present

## 2016-11-03 DIAGNOSIS — H1045 Other chronic allergic conjunctivitis: Secondary | ICD-10-CM | POA: Diagnosis not present

## 2016-11-04 ENCOUNTER — Ambulatory Visit (INDEPENDENT_AMBULATORY_CARE_PROVIDER_SITE_OTHER): Payer: 59 | Admitting: Sports Medicine

## 2016-11-04 DIAGNOSIS — G8929 Other chronic pain: Secondary | ICD-10-CM

## 2016-11-04 DIAGNOSIS — M5440 Lumbago with sciatica, unspecified side: Secondary | ICD-10-CM | POA: Diagnosis not present

## 2016-11-04 DIAGNOSIS — M7742 Metatarsalgia, left foot: Secondary | ICD-10-CM

## 2016-11-04 DIAGNOSIS — M7741 Metatarsalgia, right foot: Secondary | ICD-10-CM

## 2016-11-04 DIAGNOSIS — M79604 Pain in right leg: Secondary | ICD-10-CM | POA: Diagnosis not present

## 2016-11-04 NOTE — Progress Notes (Signed)
OFFICE VISIT NOTE Sherry Luna, [place] at [place]  Sherry Luna - 31 y.o. female MRN [id number]  Date of birth: 02/09/1986  Visit Date: 11/04/2016  PCP: [name], MD   Referred by: [name], MD  [name]. [name], ATC acting as scribe for Dr. [name].  SUBJECTIVE:   Chief Complaint  Patient presents with  . Back Pain   HPI: As below and per problem based documentation when appropriate.  Patient is here for appt today for bike fitting per Dr [name]. *See Previous note.  She is continuing to have back and right leg symptoms especially when she becomes fatigued.  She is written 30 miles earlier this morning and presents for dynamic bike for evaluation.    Review of Systems  Constitutional: Negative for chills, fever and weight loss.  All other systems reviewed and are negative.   Otherwise per HPI.  HISTORY & PERTINENT PRIOR DATA:  No specialty comments available. She reports that she has never smoked. She has never used smokeless tobacco. No results for input(s): HGBA1C, LABURIC in the last 8760 hours. Medications & Allergies reviewed per EMR Patient Active Problem List   Diagnosis Date Noted  . Metatarsalgia of both feet 11/15/2016  . Right leg pain 10/31/2016  . Chronic right-sided low back pain with sciatica 09/29/2016  . Intervertebral disk disease 07/11/2016  . Lipoma of neck 10/05/2015   No past medical history on file. No family history on file. Past Surgical History:  Procedure Laterality Date  . APPENDECTOMY    . CHOLECYSTECTOMY    . FRACTURE SURGERY    . LIPOMA EXCISION Right 11/26/2015   Procedure: EXCISION POSTERIOR NECK LIPOMA;  Surgeon: [name], DO;  Location: [facility];  Service: Plastics;  Laterality: Right;  . LUMBAR MICRODISCECTOMY Right    Dr. [name]   Social History   Occupational History  . Not on file.   Social History Main  Topics  . Smoking status: Never Smoker  . Smokeless tobacco: Never Used  . Alcohol use Yes     Comment: occas  . Drug use: No  . Sexual activity: Yes    Birth control/ protection: IUD    OBJECTIVE:  VS:  HT:    WT:   BMI:     BP:   HR: bpm  TEMP: ( )  RESP:  EXAM: Findings:  Her bike is fitted to a stationary trainer and biomechanical assessment is made.  She does have a dynamic genu valgus of the right greater than left leg.  She has collapse of her longitudinal arch on the right greater than left is present bilaterally.  Slight  out toeing of her right greater than left foot.  Overall her lateral position is adequate however she does appear to be slightly overextended.  Her cleat position is as far back as possible and saddle position as far forward as possible but given the lack seat tube angle she does still appears to be slightly behind the pedal spindle.     No results found. ASSESSMENT & PLAN:   Problem List Items Addressed This Visit    Chronic right-sided low back pain with sciatica   Right leg pain - Primary    >50% of this 60 minute visit spent in direct patient counseling and dynamic bike fit analysis.  Patient had good improvement in her subjective symptoms and overall improved biomechanics from both a anterior as  well as lateral view. Dynamic knee tracking was improved as well as more appropriate hip and knee angles.  Difficulties improvement were the custom fitted orthotics were fabricated for her as below.  As not only helped her metatarsalgia and foot pain but additionally improved biomechanics of both her foot and knee.      Metatarsalgia of both feet    PROCEDURE: ORTHOTIC MANAGEMENT Patient's underlying musculoskeletal conditions are directly related to poor biomechanics and will benefit from a functional custom orthotic.  There are no significant foot deformities that complicate the use of a custom orthotic.  Patient was provided IGLI Active orthotics.  The  patient was laid in a prone position with her hips and ankles flexed to 90. The orthotics were custom molded to her foot in a subtalar neutral position.  Postings were then applied appropriately.  Direct fabrication, modification, fitting, reevaluating and training the patient on appropriate use and care was provided.  Patient noted the orthotics provided adequate comfort and there is no reported pressure points or areas of discomfort.           Follow-up: Return if symptoms worsen or fail to improve.   CMA/ATC served as Education administrator during this visit. History, Physical, and Plan performed by medical provider. Documentation and orders reviewed and attested to.      Teresa Coombs, [facility] Physician

## 2016-11-14 ENCOUNTER — Encounter: Payer: Self-pay | Admitting: Sports Medicine

## 2016-11-15 DIAGNOSIS — M7742 Metatarsalgia, left foot: Secondary | ICD-10-CM

## 2016-11-15 DIAGNOSIS — M7741 Metatarsalgia, right foot: Secondary | ICD-10-CM | POA: Insufficient documentation

## 2016-11-15 NOTE — Assessment & Plan Note (Signed)
PROCEDURE: ORTHOTIC MANAGEMENT Patient's underlying musculoskeletal conditions are directly related to poor biomechanics and will benefit from a functional custom orthotic.  There are no significant foot deformities that complicate the use of a custom orthotic.  Patient was provided IGLI Active orthotics.  The patient was laid in a prone position with her hips and ankles flexed to 90. The orthotics were custom molded to her foot in a subtalar neutral position.  Postings were then applied appropriately.  Direct fabrication, modification, fitting, reevaluating and training the patient on appropriate use and care was provided.  Patient noted the orthotics provided adequate comfort and there is no reported pressure points or areas of discomfort.

## 2016-11-15 NOTE — Assessment & Plan Note (Signed)
>  50% of this 60 minute visit spent in direct patient counseling and dynamic bike fit analysis.  Patient had good improvement in her subjective symptoms and overall improved biomechanics from both a anterior as well as lateral view. Dynamic knee tracking was improved as well as more appropriate hip and knee angles.  Difficulties improvement were the custom fitted orthotics were fabricated for her as below.  As not only helped her metatarsalgia and foot pain but additionally improved biomechanics of both her foot and knee.

## 2016-11-29 DIAGNOSIS — R300 Dysuria: Secondary | ICD-10-CM | POA: Diagnosis not present

## 2016-12-13 ENCOUNTER — Ambulatory Visit (INDEPENDENT_AMBULATORY_CARE_PROVIDER_SITE_OTHER): Payer: 59 | Admitting: Sports Medicine

## 2016-12-13 ENCOUNTER — Ambulatory Visit (INDEPENDENT_AMBULATORY_CARE_PROVIDER_SITE_OTHER): Payer: 59

## 2016-12-13 ENCOUNTER — Encounter: Payer: Self-pay | Admitting: Sports Medicine

## 2016-12-13 VITALS — BP 100/70 | HR 60 | Ht 64.75 in | Wt 193.2 lb

## 2016-12-13 DIAGNOSIS — S99921A Unspecified injury of right foot, initial encounter: Secondary | ICD-10-CM

## 2016-12-13 DIAGNOSIS — S060X0A Concussion without loss of consciousness, initial encounter: Secondary | ICD-10-CM

## 2016-12-13 DIAGNOSIS — G8929 Other chronic pain: Secondary | ICD-10-CM

## 2016-12-13 DIAGNOSIS — G479 Sleep disorder, unspecified: Secondary | ICD-10-CM | POA: Diagnosis not present

## 2016-12-13 DIAGNOSIS — M5440 Lumbago with sciatica, unspecified side: Secondary | ICD-10-CM | POA: Diagnosis not present

## 2016-12-13 DIAGNOSIS — M79671 Pain in right foot: Secondary | ICD-10-CM | POA: Diagnosis not present

## 2016-12-13 MED ORDER — ZOLPIDEM TARTRATE 5 MG PO TABS
5.0000 mg | ORAL_TABLET | Freq: Every evening | ORAL | 0 refills | Status: DC | PRN
Start: 2016-12-13 — End: 2017-12-04
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
## 2017-01-02 DIAGNOSIS — S060X0A Concussion without loss of consciousness, initial encounter: Secondary | ICD-10-CM | POA: Insufficient documentation

## 2017-01-02 DIAGNOSIS — G479 Sleep disorder, unspecified: Secondary | ICD-10-CM | POA: Insufficient documentation

## 2017-01-02 NOTE — Assessment & Plan Note (Signed)
Symptoms are consistent with concussion.  She has essentially resolved which will allow her to return to her baseline.  If any activity seems to worsen her symptoms she is aware that she should remain out of activities until her symptoms resolved.  She is aware of the complex nature of concussions.  Plan follow-up and she returns from [place]

## 2017-01-02 NOTE — Assessment & Plan Note (Signed)
History of sleep difficulty while traveling for her occupation.  She is tolerating Ambien well and is aware of side effects.  Short prescription provided especially given the importance of sleep with concussion recovery.
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
## 2017-01-12 DIAGNOSIS — N898 Other specified noninflammatory disorders of vagina: Secondary | ICD-10-CM | POA: Diagnosis not present

## 2017-01-12 DIAGNOSIS — Z118 Encounter for screening for other infectious and parasitic diseases: Secondary | ICD-10-CM | POA: Diagnosis not present

## 2017-02-15 ENCOUNTER — Encounter: Payer: Self-pay | Admitting: Sports Medicine

## 2017-03-26 ENCOUNTER — Encounter: Payer: Self-pay | Admitting: Sports Medicine

## 2017-04-05 ENCOUNTER — Ambulatory Visit: Payer: 59 | Admitting: Sports Medicine

## 2017-04-10 ENCOUNTER — Encounter: Payer: Self-pay | Admitting: Sports Medicine

## 2017-04-10 ENCOUNTER — Ambulatory Visit (INDEPENDENT_AMBULATORY_CARE_PROVIDER_SITE_OTHER): Payer: 59 | Admitting: Sports Medicine

## 2017-04-10 VITALS — BP 110/62 | HR 46 | Ht 64.75 in | Wt 186.2 lb

## 2017-04-10 DIAGNOSIS — S060X0D Concussion without loss of consciousness, subsequent encounter: Secondary | ICD-10-CM

## 2017-04-10 DIAGNOSIS — Z23 Encounter for immunization: Secondary | ICD-10-CM

## 2017-04-10 DIAGNOSIS — J45990 Exercise induced bronchospasm: Secondary | ICD-10-CM | POA: Insufficient documentation

## 2017-04-10 MED ORDER — PANTOPRAZOLE SODIUM 40 MG PO TBEC: 40.0000 mg | DELAYED_RELEASE_TABLET | Freq: Every day | ORAL | 3 refills | Status: DC

## 2017-04-10 MED ORDER — ALBUTEROL SULFATE HFA 108 (90 BASE) MCG/ACT IN AERS: 2.0000 | INHALATION_SPRAY | Freq: Four times a day (QID) | RESPIRATORY_TRACT | 0 refills | Status: DC | PRN

## 2017-04-10 MED ORDER — AEROCHAMBER PLUS MISC: 2 refills | Status: DC

## 2017-04-10 MED ORDER — PANTOPRAZOLE SODIUM 40 MG PO TBEC: 40.0000 mg | DELAYED_RELEASE_TABLET | Freq: Two times a day (BID) | ORAL | 3 refills | Status: DC
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
## 2017-04-10 NOTE — Patient Instructions (Signed)
Try taking the reflux medicine twice per day.

## 2017-04-10 NOTE — Assessment & Plan Note (Signed)
Overall she is doing well.  Stable on Lyrica.>50% of this 25 minute visit spent in direct patient counseling and/or coordination of care.  Discussion was focused on education regarding the in discussing the pathoetiology and anticipated clinical course of the above condition.  Continue with body weight exercises and avoiding heavy lifting with weights recommended at this time.  She is overall done well with increasing her activity and weight loss.

## 2017-04-10 NOTE — Assessment & Plan Note (Signed)
Question vocal cord dysfunction secondary to acid reflux versus true exercise-induced bronchoconstriction.  We will have her try peak flow meter pre-and post exercise.  Also began with acid reflux suppression for the next 6 weeks.  We will plan to check in with her at that time.  If persistent symptoms with exercise with true decreases in her peak flow values we will need to consider inhaled corticosteroid treatment.

## 2017-04-10 NOTE — Assessment & Plan Note (Signed)
She has done well, having no persistent sequela.

## 2017-04-17 ENCOUNTER — Encounter: Payer: Self-pay | Admitting: Sports Medicine

## 2017-04-17 DIAGNOSIS — R5383 Other fatigue: Secondary | ICD-10-CM

## 2017-04-17 DIAGNOSIS — M79604 Pain in right leg: Secondary | ICD-10-CM

## 2017-04-18 NOTE — Addendum Note (Signed)
Addended by: Frutoso Chase A on: 04/18/2017 01:35 PM   Modules accepted: Orders

## 2017-04-19 ENCOUNTER — Other Ambulatory Visit: Payer: Self-pay

## 2017-04-19 ENCOUNTER — Other Ambulatory Visit: Payer: Self-pay | Admitting: Sports Medicine

## 2017-04-19 DIAGNOSIS — G8929 Other chronic pain: Secondary | ICD-10-CM

## 2017-04-19 DIAGNOSIS — M5440 Lumbago with sciatica, unspecified side: Principal | ICD-10-CM

## 2017-04-19 MED ORDER — PREGABALIN 75 MG PO CAPS: 75.0000 mg | ORAL_CAPSULE | Freq: Two times a day (BID) | ORAL | 2 refills | Status: DC

## 2017-04-20 DIAGNOSIS — R5383 Other fatigue: Secondary | ICD-10-CM | POA: Diagnosis not present

## 2017-04-20 DIAGNOSIS — M79604 Pain in right leg: Secondary | ICD-10-CM | POA: Diagnosis not present

## 2017-04-21 LAB — COMPREHENSIVE METABOLIC PANEL
AG RATIO: 1.8 (calc) (ref 1.0–2.5)
ALT: 11 U/L (ref 6–29)
AST: 17 U/L (ref 10–30)
Albumin: 4.2 g/dL (ref 3.6–5.1)
Alkaline phosphatase (APISO): 49 U/L (ref 33–115)
BUN: 10 mg/dL (ref 7–25)
CHLORIDE: 104 mmol/L (ref 98–110)
CO2: 31 mmol/L (ref 20–32)
Calcium: 9.5 mg/dL (ref 8.6–10.2)
Creat: 0.7 mg/dL (ref 0.50–1.10)
GLOBULIN: 2.3 g/dL (ref 1.9–3.7)
GLUCOSE: 94 mg/dL (ref 65–139)
Potassium: 4.6 mmol/L (ref 3.5–5.3)
Sodium: 141 mmol/L (ref 135–146)
Total Bilirubin: 1.2 mg/dL (ref 0.2–1.2)
Total Protein: 6.5 g/dL (ref 6.1–8.1)

## 2017-04-21 LAB — CBC WITH DIFFERENTIAL/PLATELET
BASOS PCT: 0.3 %
Basophils Absolute: 20 cells/uL (ref 0–200)
Eosinophils Absolute: 33 cells/uL (ref 15–500)
Eosinophils Relative: 0.5 %
HCT: 38 % (ref 35.0–45.0)
HEMOGLOBIN: 12.8 g/dL (ref 11.7–15.5)
LYMPHS ABS: 2327 {cells}/uL (ref 850–3900)
MCH: 32.5 pg (ref 27.0–33.0)
MCHC: 33.7 g/dL (ref 32.0–36.0)
MCV: 96.4 fL (ref 80.0–100.0)
MONOS PCT: 7.1 %
MPV: 10.2 fL (ref 7.5–12.5)
NEUTROS ABS: 3660 {cells}/uL (ref 1500–7800)
Neutrophils Relative %: 56.3 %
Platelets: 225 10*3/uL (ref 140–400)
RBC: 3.94 10*6/uL (ref 3.80–5.10)
RDW: 11.6 % (ref 11.0–15.0)
Total Lymphocyte: 35.8 %
WBC: 6.5 10*3/uL (ref 3.8–10.8)
WBCMIX: 462 {cells}/uL (ref 200–950)

## 2017-04-21 LAB — IRON,TIBC AND FERRITIN PANEL
%SAT: 25 % (calc) (ref 11–50)
FERRITIN: 22 ng/mL (ref 10–154)
IRON: 77 ug/dL (ref 40–190)
TIBC: 314 ug/dL (ref 250–450)

## 2017-04-21 LAB — TSH: TSH: 2.02 mIU/L

## 2017-04-21 LAB — VITAMIN D 25 HYDROXY (VIT D DEFICIENCY, FRACTURES): Vit D, 25-Hydroxy: 33 ng/mL (ref 30–100)

## 2017-04-27 ENCOUNTER — Telehealth: Payer: Self-pay | Admitting: Sports Medicine

## 2017-04-27 NOTE — Telephone Encounter (Signed)
Please call back RE labs.

## 2017-04-27 NOTE — Telephone Encounter (Signed)
Called [phone] and left VM to call the office.

## 2017-04-28 ENCOUNTER — Encounter: Payer: Self-pay | Admitting: Sports Medicine

## 2017-04-28 ENCOUNTER — Ambulatory Visit: Payer: 59 | Admitting: Sports Medicine

## 2017-04-28 VITALS — BP 102/70 | HR 50 | Ht 64.75 in | Wt 189.8 lb

## 2017-04-28 DIAGNOSIS — R591 Generalized enlarged lymph nodes: Secondary | ICD-10-CM

## 2017-04-28 DIAGNOSIS — R5383 Other fatigue: Secondary | ICD-10-CM | POA: Insufficient documentation

## 2017-04-28 DIAGNOSIS — J45990 Exercise induced bronchospasm: Secondary | ICD-10-CM

## 2017-04-28 MED ORDER — BECLOMETHASONE DIPROP HFA 80 MCG/ACT IN AERB: 2.0000 | INHALATION_SPRAY | Freq: Two times a day (BID) | RESPIRATORY_TRACT | 1 refills | Status: DC

## 2017-04-28 MED ORDER — INTEGRA PLUS PO CAPS: 1.0000 | ORAL_CAPSULE | Freq: Every day | ORAL | 1 refills | Status: DC

## 2017-04-28 NOTE — Assessment & Plan Note (Signed)
Patient does report significant changes in values with albuterol inhaler.  Given going below 300 when significantly symptomatic and above 500 when using albuterol controller medication indicated at this time.  Prescription sent today.  Will need continue monitoring peak flows.
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
## 2017-04-28 NOTE — Telephone Encounter (Signed)
Pt seen this morning (04/28/17) and lab results reviewed.  -MW

## 2017-05-01 ENCOUNTER — Telehealth: Payer: Self-pay | Admitting: Sports Medicine

## 2017-05-01 ENCOUNTER — Other Ambulatory Visit: Payer: Self-pay

## 2017-05-01 DIAGNOSIS — R5383 Other fatigue: Secondary | ICD-10-CM

## 2017-05-01 DIAGNOSIS — J45990 Exercise induced bronchospasm: Secondary | ICD-10-CM

## 2017-05-01 MED ORDER — BECLOMETHASONE DIPROP HFA 80 MCG/ACT IN AERB
2.0000 | INHALATION_SPRAY | Freq: Two times a day (BID) | RESPIRATORY_TRACT | 1 refills | Status: DC
Start: 2017-05-01 — End: 2018-04-11

## 2017-05-01 MED ORDER — INTEGRA PLUS PO CAPS: 1.0000 | ORAL_CAPSULE | Freq: Every day | ORAL | 1 refills | Status: DC

## 2017-05-01 NOTE — Telephone Encounter (Signed)
Patient called to check on lab results. I advised her that we do not have the results back just yet. Please call and advise once lab results are available.

## 2017-05-01 NOTE — Telephone Encounter (Signed)
MEDICATION:  beclomethasone (QVAR REDIHALER) 80 MCG/ACT inhaler FeFum-FePoly-FA-B Cmp-C-Biot (INTEGRA PLUS) CAPS  PHARMACY:  [facility] - [place], [place] - [address]  IS THIS A 90 DAY SUPPLY : no  IS PATIENT OUT OF MEDICATION: yes; never recieved  IF NOT; HOW MUCH IS LEFT: n/a  LAST APPOINTMENT DATE: @11 /16/2018  NEXT APPOINTMENT DATE:@Visit  date not found  OTHER COMMENTS: Was sent to the incorrect pharmacy initially   **Let patient know to contact pharmacy at the end of the day to make sure medication is ready. **  ** Please notify patient to allow 48-72 hours to process**  **Encourage patient to contact the pharmacy for refills or they can request refills through [facility]**

## 2017-05-01 NOTE — Telephone Encounter (Signed)
Rx sent to [facility].

## 2017-05-02 DIAGNOSIS — B977 Papillomavirus as the cause of diseases classified elsewhere: Secondary | ICD-10-CM | POA: Diagnosis not present

## 2017-05-02 DIAGNOSIS — Z6831 Body mass index (BMI) 31.0-31.9, adult: Secondary | ICD-10-CM | POA: Diagnosis not present

## 2017-05-02 DIAGNOSIS — Z01419 Encounter for gynecological examination (general) (routine) without abnormal findings: Secondary | ICD-10-CM | POA: Diagnosis not present

## 2017-05-02 NOTE — Telephone Encounter (Signed)
My chart message sent regarding the EBV results.  CMV still pending.

## 2017-05-03 LAB — EPSTEIN-BARR VIRUS VCA ANTIBODY PANEL
EBV NA IgG: 371 U/mL — ABNORMAL HIGH
EBV VCA IgM: <36 U/mL

## 2017-05-03 LAB — CMV ABS, IGG+IGM (CYTOMEGALOVIRUS): CMV IgM: <30 AU/mL

## 2017-06-22 ENCOUNTER — Encounter: Payer: Self-pay | Admitting: Sports Medicine

## 2017-07-03 ENCOUNTER — Ambulatory Visit: Payer: 59 | Admitting: Sports Medicine

## 2017-07-03 ENCOUNTER — Encounter: Payer: Self-pay | Admitting: Sports Medicine

## 2017-07-03 DIAGNOSIS — G479 Sleep disorder, unspecified: Secondary | ICD-10-CM | POA: Diagnosis not present

## 2017-07-03 DIAGNOSIS — M5440 Lumbago with sciatica, unspecified side: Secondary | ICD-10-CM

## 2017-07-03 DIAGNOSIS — G8929 Other chronic pain: Secondary | ICD-10-CM

## 2017-07-03 DIAGNOSIS — R5383 Other fatigue: Secondary | ICD-10-CM

## 2017-07-03 MED ORDER — PREGABALIN 50 MG PO CAPS: 50.0000 mg | ORAL_CAPSULE | Freq: Two times a day (BID) | ORAL | 2 refills | Status: DC

## 2017-07-03 NOTE — Assessment & Plan Note (Signed)
This is slightly improved from in the past.  She is continue take 2.5 mg melatonin nightly.  No significant daytime somnolence but could consider decreasing to 1 mg if any persistent fatigue issues.
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
## 2017-07-03 NOTE — Progress Notes (Signed)
Sherry Luna, [facility] [place]  ERIE SICA - 32 y.o. female MRN [id number]  Date of birth: 04/22/1986  Visit Date: 07/03/2017  PCP: [name], MD   Referred by: [name], MD   Scribe for today's visit: Josepha Pigg, CMA     SUBJECTIVE:  Sherry Luna is here for Follow-up (fatigue, exercise induced bronchoconstriction)    She went to taking Lyrica once a day in the morning instead of twice a day on 1/7, and I'm a mess. Panic attacks, anxiety, trouble sleeping, mood swings, thinking about disordered eating patterns again, self-harm ideation, really unlike me. Also developed a patch of what she thought was contact dermatitis on her arm and her face is puffy. She went back to taking Lyrica BID 06/26/17. She feels like pain sx are under better control after starting back BID. She also feels that her mood is better but she still has trouble with mood swings, trouble concentrating. She is sleeping better since being back on BID. She would like to find a safe way to wean off of Lyrica. She has recently noticed that her eye sight has been worsening.    ROS Denies night time disturbances. Denies fevers, chills, or night sweats. Denies unexplained weight loss. Denies personal history of cancer. Reports changes in bowel or bladder habits. She has recently started on probiotic which is causing flatulence.  Denies recent unreported falls. Denies new or worsening dyspnea or wheezing. Denies headaches or dizziness.  Denies numbness, tingling or weakness  In the extremities.  Denies dizziness or presyncopal episodes Denies lower extremity edema     HISTORY & PERTINENT PRIOR DATA:  Prior History reviewed and updated per electronic medical record.  Significant history, findings, studies and interim changes include:  reports that  has never smoked. she has never used smokeless tobacco. No results for input(s):  HGBA1C, LABURIC, CREATINE in the last 8760 hours. No specialty comments available. Problem  Fatigue  Sleep Disturbance  Chronic Right-Sided Low Back Pain With Sciatica  Lipoma of Neck (Resolved)    OBJECTIVE:  VS:  HT:5' 4.75" (164.5 cm)   WT:194 lb 9.6 oz (88.3 kg)  BMI:32.62    BP:110/72  HR:66bpm  TEMP: ( )  RESP:99 %   Wt Readings from Last 3 Encounters:  07/03/17 194 lb 9.6 oz (88.3 kg)  04/28/17 189 lb 12.8 oz (86.1 kg)  04/10/17 186 lb 3.2 oz (84.5 kg)     PHYSICAL EXAM: Constitutional: WDWN, Non-toxic appearing. Psychiatric: Alert & appropriately interactive.  Not depressed or anxious appearing. Respiratory: No increased work of breathing.  Trachea Midline Eyes: Pupils are equal.  EOM intact without nystagmus.  No scleral icterus  NEUROVASCULAR exam: No clubbing or cyanosis appreciated No significant venous stasis changes Capillary Refill: normal, less than 2 seconds   UPPER Extremities LOWER Extremities  Generalized UE Edema: No significant edema Radial Pulses: Normal & symmetrically palpable Dermatomes intact to light touch Normal strength in all myotomes Reflexes: Normal & symmetric DTRs Generalized UE Edema: No significant edema Radial Pulses: Normal & symmetrically palpable Dermatomes intact to light touch Normal strength in all myotomes Reflexes: Normal & symmetric DTRs    No additional findings.   ASSESSMENT & PLAN:   1. Chronic right-sided low back pain with sciatica, sciatica laterality unspecified   2. Fatigue, unspecified type   3. Sleep disturbance    PLAN:    Patient is also continuing to have some ongoing  saddle issues.  CROMAG Saddle  recommended to be tried.   Chronic right-sided low back pain with sciatica She has been doing well on Lyrica which is been helpful for her leg pain as well as with the stress related anxiety and stress management.  Do think that having her continue on this will try to reduce the amount that she is on  will be beneficial long-term.  She would like to try to cut back on this as much as possible and will start her on 50 mg dosing twice per day.  Fatigue She continues to have some issues with fatigue and this is likely secondary to some of the training/on the recovery affect.  She has pulled multiple directions with her job as well as with her training desires and is having a difficult time fully assimilating this.  Discussed multiple strategies including some time off the bike after her upcoming race next month.  Additionally counseling for stress and anxiety would be beneficial.  She is not interested in adding additional pharmacologic therapy at this time we will consider additional psychological counseling.  Do think that she may have some component of yeast overgrowth syndrome as she was recently told by her just she had asymptomatic vaginal candidiasis; additionally she has increased flatulence with addition of probiotics which can occur when initiating treatment.  Additional digestive enzymes with resumption of probiotics recommended at this time.  Plan to have her add an additional zinc supplement as well as this can be beneficial if she is micronutrient deficient and given the nutritional restrictions that she is having I would not be surprised if this were the case.  We will plan to follow-up with her by telephone in the next several weeks to ensure clinical improvement and if any lack of improvement will plan to consider SSRI/SNRI.    Sleep disturbance This is slightly improved from in the past.  She is continue take 2.5 mg melatonin nightly.  No significant daytime somnolence but could consider decreasing to 1 mg if any persistent fatigue issues.   ++++++++++++++++++++++++++++++++++++++++++++ Orders & Meds: No orders of the defined types were placed in this encounter.   Meds ordered this encounter  Medications  . pregabalin (LYRICA) 50 MG capsule    Sig: Take 1 capsule (50 mg total) by  mouth 2 (two) times daily.    Dispense:  60 capsule    Refill:  2    ++++++++++++++++++++++++++++++++++++++++++++ Follow-up: Return if symptoms worsen or fail to improve.   Pertinent documentation may be included in additional procedure notes, imaging studies, problem based documentation and patient instructions. Please see these sections of the encounter for additional information regarding this visit. CMA/ATC served as Education administrator during this visit. History, Physical, and Plan performed by medical provider. Documentation and orders reviewed and attested to.      Gerda Diss, [place] [facility] Medicine Physician

## 2017-07-03 NOTE — Assessment & Plan Note (Signed)
She has been doing well on Lyrica which is been helpful for her leg pain as well as with the stress related anxiety and stress management.  Do think that having her continue on this will try to reduce the amount that she is on will be beneficial long-term.  She would like to try to cut back on this as much as possible and will start her on 50 mg dosing twice per day.

## 2017-07-17 ENCOUNTER — Encounter: Payer: Self-pay | Admitting: Sports Medicine

## 2017-07-24 ENCOUNTER — Encounter: Payer: Self-pay | Admitting: Sports Medicine

## 2017-07-25 ENCOUNTER — Other Ambulatory Visit: Payer: Self-pay | Admitting: Sports Medicine

## 2017-07-25 MED ORDER — LORAZEPAM 0.5 MG PO TABS: 0.5000 mg | ORAL_TABLET | Freq: Three times a day (TID) | ORAL | 0 refills | Status: DC | PRN

## 2017-08-07 ENCOUNTER — Other Ambulatory Visit: Payer: Self-pay | Admitting: Sports Medicine

## 2017-08-09 ENCOUNTER — Telehealth: Payer: Self-pay | Admitting: Sports Medicine

## 2017-08-09 DIAGNOSIS — M5440 Lumbago with sciatica, unspecified side: Principal | ICD-10-CM

## 2017-08-09 DIAGNOSIS — G8929 Other chronic pain: Secondary | ICD-10-CM

## 2017-08-09 MED ORDER — PREGABALIN 50 MG PO CAPS: 50.0000 mg | ORAL_CAPSULE | Freq: Three times a day (TID) | ORAL | 2 refills | Status: DC

## 2017-08-09 NOTE — Telephone Encounter (Signed)
Called pt and left VM to call the office.  

## 2017-08-09 NOTE — Telephone Encounter (Signed)
See note

## 2017-08-09 NOTE — Telephone Encounter (Signed)
Copied from [place]. Topic: Quick Communication - See Telephone Encounter >> Aug 09, 2017  7:46 AM Arletha Grippe wrote: CRM for notification. See Telephone encounter for:   08/09/17. Pt called about her lyrica that was discussed  - she states that Dr [name] told her she had some flexibility in her dose based on the level of pain that she is having.  Pt wants to know if it is ok to take 3 tabs per day to make a total of 150 mg in a day.  Please call [phone].  Pt is leaving for out of country tomorrow, and needs answer today

## 2017-08-09 NOTE — Telephone Encounter (Signed)
Spoke to pt and informed her that she can take the Lyrica 50 mg tid vs 75 mg bid.

## 2017-08-09 NOTE — Addendum Note (Signed)
Addended by: Teresa Coombs D on: 08/09/2017 12:10 PM   Modules accepted: Orders

## 2017-08-09 NOTE — Telephone Encounter (Signed)
Yes I agree with the change to 50 mg p.o. 3 times daily as needed.  I will send in prescription since it is controlled.

## 2017-08-09 NOTE — Telephone Encounter (Signed)
Last MyChart note said OK to take Lyrica 75 mg BID, OK to take 50 mg TID instead?

## 2017-09-16 ENCOUNTER — Telehealth: Payer: Self-pay | Admitting: Sports Medicine

## 2017-09-16 MED ORDER — METHYLPREDNISOLONE 4 MG PO TBPK: ORAL_TABLET | ORAL | 0 refills | Status: DC

## 2017-09-19 ENCOUNTER — Encounter: Payer: Self-pay | Admitting: Sports Medicine

## 2017-09-22 ENCOUNTER — Encounter: Payer: Self-pay | Admitting: Sports Medicine

## 2017-09-22 ENCOUNTER — Other Ambulatory Visit: Payer: Self-pay

## 2017-09-22 DIAGNOSIS — G8929 Other chronic pain: Secondary | ICD-10-CM

## 2017-09-22 DIAGNOSIS — M5440 Lumbago with sciatica, unspecified side: Principal | ICD-10-CM

## 2017-09-22 MED ORDER — PREGABALIN 75 MG PO CAPS: 75.0000 mg | ORAL_CAPSULE | Freq: Two times a day (BID) | ORAL | 2 refills | Status: DC

## 2017-09-22 NOTE — Telephone Encounter (Signed)
Yes okay to increase. Back to that

## 2017-10-23 ENCOUNTER — Ambulatory Visit: Payer: 59 | Admitting: Psychology

## 2017-11-07 ENCOUNTER — Ambulatory Visit (INDEPENDENT_AMBULATORY_CARE_PROVIDER_SITE_OTHER): Payer: 59 | Admitting: Psychology

## 2017-11-07 DIAGNOSIS — F411 Generalized anxiety disorder: Secondary | ICD-10-CM

## 2017-11-07 DIAGNOSIS — H52223 Regular astigmatism, bilateral: Secondary | ICD-10-CM | POA: Diagnosis not present

## 2017-11-07 DIAGNOSIS — H0100B Unspecified blepharitis left eye, upper and lower eyelids: Secondary | ICD-10-CM | POA: Diagnosis not present

## 2017-11-07 DIAGNOSIS — H5203 Hypermetropia, bilateral: Secondary | ICD-10-CM | POA: Diagnosis not present

## 2017-11-07 DIAGNOSIS — H0100A Unspecified blepharitis right eye, upper and lower eyelids: Secondary | ICD-10-CM | POA: Diagnosis not present

## 2017-11-20 ENCOUNTER — Ambulatory Visit (INDEPENDENT_AMBULATORY_CARE_PROVIDER_SITE_OTHER): Payer: 59 | Admitting: Psychology

## 2017-11-20 ENCOUNTER — Ambulatory Visit (INDEPENDENT_AMBULATORY_CARE_PROVIDER_SITE_OTHER): Payer: 59 | Admitting: Family Medicine

## 2017-11-20 ENCOUNTER — Encounter: Payer: Self-pay | Admitting: Family Medicine

## 2017-11-20 VITALS — BP 103/69 | HR 48 | Ht 65.0 in | Wt 190.8 lb

## 2017-11-20 DIAGNOSIS — G589 Mononeuropathy, unspecified: Secondary | ICD-10-CM

## 2017-11-20 DIAGNOSIS — D509 Iron deficiency anemia, unspecified: Secondary | ICD-10-CM

## 2017-11-20 DIAGNOSIS — Z0000 Encounter for general adult medical examination without abnormal findings: Secondary | ICD-10-CM | POA: Diagnosis not present

## 2017-11-20 DIAGNOSIS — E538 Deficiency of other specified B group vitamins: Secondary | ICD-10-CM | POA: Diagnosis not present

## 2017-11-20 DIAGNOSIS — Z719 Counseling, unspecified: Secondary | ICD-10-CM

## 2017-11-20 DIAGNOSIS — E669 Obesity, unspecified: Secondary | ICD-10-CM | POA: Diagnosis not present

## 2017-11-20 DIAGNOSIS — F411 Generalized anxiety disorder: Secondary | ICD-10-CM | POA: Diagnosis not present

## 2017-11-20 NOTE — Progress Notes (Signed)
New patient office visit note:  Impression and Recommendations:    1. Encounter for wellness examination   2. Health education/counseling   3. Iron deficiency anemia, unspecified iron deficiency anemia type   4. B12 deficiency   5. Mononeuropathy   6. BMI 30-34.9    1. Encounter for wellness examination 2. Health education/counseling 3. H/o iron deficiency anemia 4. vit b12 deficiency 5. mononeuropathy  -Check labs in near future.   6. Obesity -recommend losing weight. Prudent diet/exercise. Continue your regular exercise.      Education and routine counseling performed. Handouts provided.  Orders Placed This Encounter  Procedures  . CBC with Differential/Platelet  . Comprehensive metabolic panel  . Hemoglobin A1c  . Lipid panel  . VITAMIN D 25 Hydroxy (Vit-D Deficiency, Fractures)  . TSH  . T4, free  . Vitamin B12  . Folate  . Iron and TIBC  . Ferritin    No orders of the defined types were placed in this encounter.   Gross side effects, risk and benefits, and alternatives of medications discussed with patient.  Patient is aware that all medications have potential side effects and we are unable to predict every side effect or drug-drug interaction that may occur.  Expresses verbal understanding and consents to current therapy plan and treatment regimen.  Return for Chronic OV w me near future & FBW 2-3d prior.  Please see AVS handed out to patient at the end of our visit for further patient instructions/ counseling done pertaining to today's office visit.    Note: This document was prepared using Dragon voice recognition software and may include unintentional dictation errors.  This document serves as a record of services personally performed by Mellody Dance, DO. It was created on her behalf by Mayer Masker, a trained medical scribe. The creation of this record is based on the scribe's personal observations and the provider's statements to them.   I  have reviewed the above medical documentation for accuracy and completeness and I concur.  Mellody Dance 11/20/17 5:21 PM   ----------------------------------------------------------------------------------------------------------------------    Subjective:    Chief complaint:   Chief Complaint  Patient presents with  . Establish Care     HPI: Sherry Luna is a pleasant 32 y.o. female who presents to [place] [facility] today to review their medical history with me and establish care.   I asked the patient to review their chronic problem list with me to ensure everything was updated and accurate.    All recent office visits with other providers, any medical records that patient brought in etc  - I reviewed today.     We asked pt to get [place] their medical records from [facility] providers/ specialists that they had seen within the past 3-5 years- if they are in private practice and/or do not work for [employer], [facility], [place], [place] or [employer] owned practice.  Told them to call their specialists to clarify this if they are not sure.   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  She travels with a professional cycling group.  She has a MS in sports medicine and did several years of PhD school of kinesiology before she dropped out.   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  She trains regularly for this - about 10-15 hours a week. She runs 10-12  miles a week. Meeting AHA exercise guidelines.   She is married, not planning kids yet. She has 2 greyhounds.  She is originally from [place], [place] and lives nearby to the clinic.   Other providers Other PCP was Dr. [name], DO at [facility]. She is switching to [place] because it is closer to where she lives. She has not had a PCP in years.  She has an obgyn, Dr. Ronita Hipps.   PMHx She has some trouble  sleeping. She takes [place] for jetlag and when she travels. She is a Government social research officer.   She has a h/o anemia and b12 deficiency  She has had a DEXA scan before.   She has a h/o protein in her urine due to excessive workout.   GU She complains of polydipsia and polyuria for years. She drinks 1-2 gallons of water per day. She also uses electrolyte supplements (noon and scratch) as well.   FMHx Maternal grandmother, aunt - breast CA's (ages 9s and late 69s respectively) Paternal grandfather - MI/CAD Depression (mother)   PSHx SHx See below:  She had L5 S1 discectomy to treat a back injury. She has constant S1 nerve pain. She takes lyrica and tylenol for her neuropathic pain.  C7 lypoma    Wt Readings from Last 3 Encounters:  11/20/17 190 lb 12.8 oz (86.5 kg)  07/03/17 194 lb 9.6 oz (88.3 kg)  04/28/17 189 lb 12.8 oz (86.1 kg)   BP Readings from Last 3 Encounters:  11/20/17 103/69  07/03/17 110/72  04/28/17 102/70   Pulse Readings from Last 3 Encounters:  11/20/17 (!) 48  07/03/17 66  04/28/17 (!) 50   BMI Readings from Last 3 Encounters:  11/20/17 31.75 kg/m  07/03/17 32.63 kg/m  04/28/17 31.83 kg/m    Patient Care Team    Relationship Specialty Notifications Start End  [name], MD PCP - General Family Medicine  11/23/15   Gerda Diss, DO Consulting Physician Sports Medicine  11/20/17     Patient Active Problem List   Diagnosis Date Noted  . BMI 30-34.9 11/20/2017    Priority: High  . B12 deficiency 11/20/2017    Priority: Medium  . Exercise-induced bronchoconstriction 04/10/2017    Priority: Low  . Iron deficiency anemia 11/20/2017  . Mononeuropathy 11/20/2017  . Fatigue 04/28/2017  . Sleep disturbance 01/02/2017  . Metatarsalgia of both feet 11/15/2016  . Right leg pain 10/31/2016  . Chronic right-sided low back pain with sciatica 09/29/2016  . Intervertebral disk disease 07/11/2016     No past medical history on file.   No past  medical history on file.   Past Surgical History:  Procedure Laterality Date  . APPENDECTOMY    . CHOLECYSTECTOMY    . FRACTURE SURGERY     femur  . LIPOMA EXCISION Right 11/26/2015   Procedure: EXCISION POSTERIOR NECK LIPOMA;  Surgeon: [name], DO;  Location: [facility];  Service: Plastics;  Laterality: Right;  . LUMBAR MICRODISCECTOMY Right    Dr. [name]     No family history on file.   Social History   Substance and Sexual Activity  Drug Use No     Social History   Substance and Sexual Activity  Alcohol Use Yes   Comment: occas     Social History   Tobacco Use  Smoking Status Never Smoker  Smokeless Tobacco Never Used     Current Meds  Medication Sig  . acetaminophen (TYLENOL) 325 MG tablet Take 650 mg  by mouth every 6 (six) hours as needed.  Marland Kitchen albuterol (PROVENTIL HFA;VENTOLIN HFA) 108 (90 Base) MCG/ACT inhaler Inhale 2 puffs into the lungs every 6 (six) hours as needed for wheezing or shortness of breath.  . B COMPLEX-C PO Take 1,500 mg by mouth.  . beclomethasone (QVAR REDIHALER) 80 MCG/ACT inhaler Inhale 2 puffs 2 (two) times daily into the lungs.  . calcium carbonate (OSCAL) 1500 (600 Ca) MG TABS tablet Take 2 (two) times daily by mouth.  . cetirizine (ZYRTEC) 10 MG tablet Take 10 mg by mouth daily.  Marland Kitchen FeFum-FePoly-FA-B Cmp-C-Biot (INTEGRA PLUS) CAPS Take 1 capsule at bedtime by mouth.  . levonorgestrel (MIRENA) 20 MCG/24HR IUD 1 each by Intrauterine route once.  Marland Kitchen LORazepam (ATIVAN) 0.5 MG tablet Take 1 tablet (0.5 mg total) by mouth every 8 (eight) hours as needed for anxiety.  . Omega-3 Fatty Acids (FISH OIL) 1000 MG CAPS Take 2,000 mg by mouth daily.  . pregabalin (LYRICA) 75 MG capsule Take 1 capsule (75 mg total) by mouth 2 (two) times daily.  Marland Kitchen Spacer/Aero-Holding Chambers (AEROCHAMBER PLUS) inhaler Use as instructed  . vitamin E 400 UNIT capsule Take 400 Units by mouth daily.  Marland Kitchen zolpidem (AMBIEN) 5 MG tablet Take  1-2 tablets (5-10 mg total) by mouth at bedtime as needed for sleep.    Allergies: Ceftin [cefuroxime axetil]; Ciprofloxacin; Aspirin; Doxycycline; and Zithromax [azithromycin]   Review of Systems  Constitutional: Negative for chills, diaphoresis, fever, malaise/fatigue and weight loss.  HENT: Negative for congestion, sore throat and tinnitus.   Eyes: Negative for blurred vision, double vision and photophobia.       Complains of change in vision  Respiratory: Negative for cough and wheezing.   Cardiovascular: Negative for chest pain and palpitations.  Gastrointestinal: Negative for blood in stool, diarrhea, nausea and vomiting.  Genitourinary: Positive for frequency. Negative for dysuria and urgency.       Leaking urine  Musculoskeletal: Negative for joint pain and myalgias.  Skin: Negative for itching and rash.  Neurological: Negative for dizziness, focal weakness, weakness and headaches.  Endo/Heme/Allergies: Positive for environmental allergies and polydipsia. Bruises/bleeds easily.  Psychiatric/Behavioral: Negative for depression, memory loss, substance abuse and suicidal ideas. The patient is nervous/anxious. The patient does not have insomnia.      Objective:   Blood pressure 103/69, pulse (!) 48, height 5\' 5"  (1.651 m), weight 190 lb 12.8 oz (86.5 kg), SpO2 99 %. Body mass index is 31.75 kg/m. General: Well Developed, well nourished, and in no acute distress.  Neuro: Alert and oriented x3, extra-ocular muscles intact, sensation grossly intact.  HEENT:[place]/AT, PERRLA, neck supple, No carotid bruits Skin: no gross rashes  Cardiac: Regular rate and rhythm Respiratory: Essentially clear to auscultation bilaterally. Not using accessory muscles, speaking in full sentences.  Abdominal: not grossly distended Musculoskeletal: Ambulates w/o diff, FROM * 4 ext.  Vasc: less 2 sec cap RF, warm and pink  Psych:  No HI/SI, judgement and insight good, Euthymic mood. Full Affect.    No  results found for this or any previous visit (from the past 2160 hour(s)).

## 2017-11-20 NOTE — Patient Instructions (Signed)
Please come in in the near future at your convenience for a follow-up office visit to go over your labs.  Then, 2 to 3 days prior make a separate office visit for lab only visit so we will have all the labs at your follow-up.  Please realize, EXERCISE IS MEDICINE!  -  American Heart Association [facility]) guidelines for exercise : If you are in good health, without any medical conditions, you should engage in 150 minutes of moderate intensity aerobic activity per week.  This means you should be huffing and puffing throughout your workout.   Engaging in regular exercise will improve brain function and memory, as well as improve mood, boost immune system and help with weight management.  As well as the other, more well-known effects of exercise such as decreasing blood sugar levels, decreasing blood pressure,  and decreasing bad cholesterol levels/ increasing good cholesterol levels.     -  The AHA strongly endorses consumption of a diet that contains a variety of foods from all the food categories with an emphasis on fruits and vegetables; fat-free and low-fat dairy products; cereal and grain products; legumes and nuts; and fish, poultry, and/or extra lean meats.    Excessive food intake, especially of foods high in saturated and trans fats, sugar, and salt, should be avoided.    Adequate water intake of roughly 1/2 of your weight in pounds, should equal the ounces of water per day you should drink.  So for instance, if you're 200 pounds, that would be 100 ounces of water per day.         Mediterranean Diet  Why follow it? Research shows. . Those who follow the Mediterranean diet have a reduced risk of heart disease  . The diet is associated with a reduced incidence of Parkinson's and Alzheimer's diseases . People following the diet may have longer life expectancies and lower rates of chronic diseases  . The Dietary Guidelines for Americans recommends the Mediterranean diet as an eating plan to promote  health and prevent disease  What Is the Mediterranean Diet?  . Healthy eating plan based on typical foods and recipes of Mediterranean-style cooking . The diet is primarily a plant based diet; these foods should make up a majority of meals   Starches - Plant based foods should make up a majority of meals - They are an important sources of vitamins, minerals, energy, antioxidants, and fiber - Choose whole grains, foods high in fiber and minimally processed items  - Typical grain sources include wheat, oats, barley, corn, brown rice, bulgar, farro, millet, polenta, couscous  - Various types of beans include chickpeas, lentils, fava beans, black beans, white beans   Fruits  Veggies - Large quantities of antioxidant rich fruits & veggies; 6 or more servings  - Vegetables can be eaten raw or lightly drizzled with oil and cooked  - Vegetables common to the traditional Mediterranean Diet include: artichokes, arugula, beets, broccoli, brussel sprouts, cabbage, carrots, celery, collard greens, cucumbers, eggplant, kale, leeks, lemons, lettuce, mushrooms, okra, onions, peas, peppers, potatoes, pumpkin, radishes, rutabaga, shallots, spinach, sweet potatoes, turnips, zucchini - Fruits common to the Mediterranean Diet include: apples, apricots, avocados, cherries, clementines, dates, figs, grapefruits, grapes, melons, nectarines, oranges, peaches, pears, pomegranates, strawberries, tangerines  Fats - Replace butter and margarine with healthy oils, such as olive oil, canola oil, and tahini  - Limit nuts to no more than a handful a day  - Nuts include walnuts, almonds, pecans, pistachios, pine nuts  -  Limit or avoid candied, honey roasted or heavily salted nuts - Olives are central to the Mediterranean diet - can be eaten whole or used in a variety of dishes   Meats Protein - Limiting red meat: no more than a few times a month - When eating red meat: choose lean cuts and keep the portion to the size of deck of  cards - Eggs: approx. 0 to 4 times a week  - Fish and lean poultry: at least 2 a week  - Healthy protein sources include, chicken, [place], lean beef, lamb - Increase intake of seafood such as tuna, salmon, trout, mackerel, shrimp, scallops - Avoid or limit high fat processed meats such as sausage and bacon  Dairy - Include moderate amounts of low fat dairy products  - Focus on healthy dairy such as fat free yogurt, skim milk, low or reduced fat cheese - Limit dairy products higher in fat such as whole or 2% milk, cheese, ice cream  Alcohol - Moderate amounts of red wine is ok  - No more than 5 oz daily for women (all ages) and men older than age 28  - No more than 10 oz of wine daily for men younger than 40  Other - Limit sweets and other desserts  - Use herbs and spices instead of salt to flavor foods  - Herbs and spices common to the traditional Mediterranean Diet include: basil, bay leaves, chives, cloves, cumin, fennel, garlic, lavender, marjoram, mint, oregano, parsley, pepper, rosemary, sage, savory, sumac, tarragon, thyme   It's not just a diet, it's a lifestyle:  . The Mediterranean diet includes lifestyle factors typical of those in the region  . Foods, drinks and meals are best eaten with others and savored . Daily physical activity is important for overall good health . This could be strenuous exercise like running and aerobics . This could also be more leisurely activities such as walking, housework, yard-work, or taking the stairs . Moderation is the key; a balanced and healthy diet accommodates most foods and drinks . Consider portion sizes and frequency of consumption of certain foods   Meal Ideas & Options:  . Breakfast:  o Whole wheat toast or whole wheat English muffins with peanut butter & hard boiled egg o Steel cut oats topped with apples & cinnamon and skim milk  o Fresh fruit: banana, strawberries, melon, berries, peaches  o Smoothies: strawberries, bananas, greek  yogurt, peanut butter o Low fat greek yogurt with blueberries and granola  o Egg white omelet with spinach and mushrooms o Breakfast couscous: whole wheat couscous, apricots, skim milk, cranberries  . Sandwiches:  o Hummus and grilled vegetables (peppers, zucchini, squash) on whole wheat bread   o Grilled chicken on whole wheat pita with lettuce, tomatoes, cucumbers or tzatziki  o Tuna salad on whole wheat bread: tuna salad made with greek yogurt, olives, red peppers, capers, green onions o Garlic rosemary lamb pita: lamb sauted with garlic, rosemary, salt & pepper; add lettuce, cucumber, greek yogurt to pita - flavor with lemon juice and black pepper  . Seafood:  o Mediterranean grilled salmon, seasoned with garlic, basil, parsley, lemon juice and black pepper o Shrimp, lemon, and spinach whole-grain pasta salad made with low fat greek yogurt  o Seared scallops with lemon orzo  o Seared tuna steaks seasoned salt, pepper, coriander topped with tomato mixture of olives, tomatoes, olive oil, minced garlic, parsley, green onions and cappers  . Meats:  o Herbed greek chicken salad  with kalamata olives, cucumber, feta  o Red bell peppers stuffed with spinach, bulgur, lean ground beef (or lentils) & topped with feta   o Kebabs: skewers of chicken, tomatoes, onions, zucchini, squash  o [place] burgers: made with red onions, mint, dill, lemon juice, feta cheese topped with roasted red peppers . Vegetarian o Cucumber salad: cucumbers, artichoke hearts, celery, red onion, feta cheese, tossed in olive oil & lemon juice  o Hummus and whole grain pita points with a greek salad (lettuce, tomato, feta, olives, cucumbers, red onion) o Lentil soup with celery, carrots made with vegetable broth, garlic, salt and pepper  o Tabouli salad: parsley, bulgur, mint, scallions, cucumbers, tomato, radishes, lemon juice, olive oil, salt and pepper.

## 2017-12-01 ENCOUNTER — Encounter: Payer: Self-pay | Admitting: Sports Medicine

## 2017-12-04 ENCOUNTER — Other Ambulatory Visit: Payer: Self-pay

## 2017-12-04 DIAGNOSIS — G479 Sleep disorder, unspecified: Secondary | ICD-10-CM

## 2017-12-04 MED ORDER — LORAZEPAM 0.5 MG PO TABS: 0.5000 mg | ORAL_TABLET | Freq: Three times a day (TID) | ORAL | 0 refills | Status: DC | PRN

## 2017-12-04 MED ORDER — ZOLPIDEM TARTRATE 5 MG PO TABS: 5.0000 mg | ORAL_TABLET | Freq: Every evening | ORAL | 0 refills | Status: DC | PRN

## 2017-12-11 ENCOUNTER — Ambulatory Visit (INDEPENDENT_AMBULATORY_CARE_PROVIDER_SITE_OTHER): Payer: 59

## 2017-12-11 ENCOUNTER — Ambulatory Visit: Payer: 59 | Admitting: Sports Medicine

## 2017-12-11 ENCOUNTER — Encounter: Payer: Self-pay | Admitting: Sports Medicine

## 2017-12-11 ENCOUNTER — Ambulatory Visit: Payer: Self-pay

## 2017-12-11 VITALS — BP 106/74 | HR 59 | Ht 65.0 in | Wt 200.6 lb

## 2017-12-11 DIAGNOSIS — M25561 Pain in right knee: Secondary | ICD-10-CM

## 2017-12-11 DIAGNOSIS — S8991XA Unspecified injury of right lower leg, initial encounter: Secondary | ICD-10-CM | POA: Diagnosis not present
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
## 2017-12-11 NOTE — Patient Instructions (Signed)

## 2017-12-13 ENCOUNTER — Other Ambulatory Visit: Payer: 59

## 2017-12-13 DIAGNOSIS — E538 Deficiency of other specified B group vitamins: Secondary | ICD-10-CM

## 2017-12-13 DIAGNOSIS — D509 Iron deficiency anemia, unspecified: Secondary | ICD-10-CM | POA: Diagnosis not present

## 2017-12-13 DIAGNOSIS — G589 Mononeuropathy, unspecified: Secondary | ICD-10-CM | POA: Diagnosis not present

## 2017-12-13 DIAGNOSIS — Z0000 Encounter for general adult medical examination without abnormal findings: Secondary | ICD-10-CM

## 2017-12-13 DIAGNOSIS — E669 Obesity, unspecified: Secondary | ICD-10-CM | POA: Diagnosis not present

## 2017-12-14 LAB — COMPREHENSIVE METABOLIC PANEL
ALT: 14 IU/L (ref 0–32)
AST: 18 IU/L (ref 0–40)
Albumin/Globulin Ratio: 1.8 (ref 1.2–2.2)
Albumin: 4.6 g/dL (ref 3.5–5.5)
Alkaline Phosphatase: 59 IU/L (ref 39–117)
BUN/Creatinine Ratio: 16 (ref 9–23)
BUN: 14 mg/dL (ref 6–20)
Bilirubin Total: 1.9 mg/dL — ABNORMAL HIGH (ref 0.0–1.2)
CO2: 24 mmol/L (ref 20–29)
Calcium: 9.6 mg/dL (ref 8.7–10.2)
Chloride: 103 mmol/L (ref 96–106)
Creatinine, Ser: 0.89 mg/dL (ref 0.57–1.00)
GFR calc Af Amer: 101 mL/min/{1.73_m2} (ref >59)
GFR, EST NON AFRICAN AMERICAN: 87 mL/min/{1.73_m2} (ref >59)
GLUCOSE: 97 mg/dL (ref 65–99)
Globulin, Total: 2.5 g/dL (ref 1.5–4.5)
Potassium: 4.7 mmol/L (ref 3.5–5.2)
Sodium: 140 mmol/L (ref 134–144)
TOTAL PROTEIN: 7.1 g/dL (ref 6.0–8.5)

## 2017-12-14 LAB — CBC WITH DIFFERENTIAL/PLATELET
BASOS ABS: 0 10*3/uL (ref 0.0–0.2)
BASOS: 0 %
EOS (ABSOLUTE): 0 10*3/uL (ref 0.0–0.4)
Eos: 0 %
HEMOGLOBIN: 13.9 g/dL (ref 11.1–15.9)
Hematocrit: 41 % (ref 34.0–46.6)
IMMATURE GRANS (ABS): 0 10*3/uL (ref 0.0–0.1)
IMMATURE GRANULOCYTES: 0 %
LYMPHS: 40 %
Lymphocytes Absolute: 2.5 10*3/uL (ref 0.7–3.1)
MCH: 32.9 pg (ref 26.6–33.0)
MCHC: 33.9 g/dL (ref 31.5–35.7)
MCV: 97 fL (ref 79–97)
MONOCYTES: 7 %
Monocytes Absolute: 0.5 10*3/uL (ref 0.1–0.9)
NEUTROS ABS: 3.4 10*3/uL (ref 1.4–7.0)
NEUTROS PCT: 53 %
Platelets: 244 10*3/uL (ref 150–450)
RBC: 4.23 x10E6/uL (ref 3.77–5.28)
RDW: 13.2 % (ref 12.3–15.4)
WBC: 6.4 10*3/uL (ref 3.4–10.8)

## 2017-12-14 LAB — T4, FREE: Free T4: 1.18 ng/dL (ref 0.82–1.77)

## 2017-12-14 LAB — HEMOGLOBIN A1C
ESTIMATED AVERAGE GLUCOSE: 97 mg/dL
Hgb A1c MFr Bld: 5 % (ref 4.8–5.6)

## 2017-12-14 LAB — IRON AND TIBC
IRON SATURATION: 45 % (ref 15–55)
IRON: 125 ug/dL (ref 27–159)
TIBC: 280 ug/dL (ref 250–450)
UIBC: 155 ug/dL (ref 131–425)

## 2017-12-14 LAB — LIPID PANEL
Chol/HDL Ratio: 2.9 ratio (ref 0.0–4.4)
Cholesterol, Total: 184 mg/dL (ref 100–199)
HDL: 64 mg/dL (ref >39)
LDL CALC: 106 mg/dL — AB (ref 0–99)
TRIGLYCERIDES: 70 mg/dL (ref 0–149)
VLDL Cholesterol Cal: 14 mg/dL (ref 5–40)

## 2017-12-14 LAB — TSH: TSH: 4.97 u[IU]/mL — ABNORMAL HIGH (ref 0.450–4.500)

## 2017-12-14 LAB — FERRITIN: Ferritin: 84 ng/mL (ref 15–150)

## 2017-12-14 LAB — VITAMIN D 25 HYDROXY (VIT D DEFICIENCY, FRACTURES): Vit D, 25-Hydroxy: 32.3 ng/mL (ref 30.0–100.0)

## 2017-12-14 LAB — VITAMIN B12: VITAMIN B 12: 449 pg/mL (ref 232–1245)

## 2017-12-14 LAB — FOLATE

## 2017-12-15 ENCOUNTER — Other Ambulatory Visit: Payer: Self-pay

## 2017-12-19 ENCOUNTER — Ambulatory Visit (INDEPENDENT_AMBULATORY_CARE_PROVIDER_SITE_OTHER): Payer: 59 | Admitting: Family Medicine

## 2017-12-19 ENCOUNTER — Encounter: Payer: Self-pay | Admitting: Family Medicine

## 2017-12-19 ENCOUNTER — Encounter: Payer: Self-pay | Admitting: Sports Medicine

## 2017-12-19 VITALS — BP 106/69 | HR 45 | Ht 65.0 in | Wt 190.5 lb

## 2017-12-19 DIAGNOSIS — D509 Iron deficiency anemia, unspecified: Secondary | ICD-10-CM | POA: Diagnosis not present

## 2017-12-19 DIAGNOSIS — R7989 Other specified abnormal findings of blood chemistry: Secondary | ICD-10-CM | POA: Diagnosis not present

## 2017-12-19 DIAGNOSIS — Z789 Other specified health status: Secondary | ICD-10-CM

## 2017-12-19 DIAGNOSIS — E559 Vitamin D deficiency, unspecified: Secondary | ICD-10-CM | POA: Diagnosis not present

## 2017-12-19 DIAGNOSIS — E538 Deficiency of other specified B group vitamins: Secondary | ICD-10-CM

## 2017-12-19 DIAGNOSIS — E66811 Obesity, class 1: Secondary | ICD-10-CM

## 2017-12-19 DIAGNOSIS — G479 Sleep disorder, unspecified: Secondary | ICD-10-CM | POA: Diagnosis not present

## 2017-12-19 DIAGNOSIS — E039 Hypothyroidism, unspecified: Secondary | ICD-10-CM | POA: Diagnosis not present

## 2017-12-19 DIAGNOSIS — E669 Obesity, unspecified: Secondary | ICD-10-CM

## 2017-12-19 DIAGNOSIS — E038 Other specified hypothyroidism: Secondary | ICD-10-CM

## 2017-12-19 MED ORDER — VITAMIN D (ERGOCALCIFEROL) 1.25 MG (50000 UNIT) PO CAPS: 50000.0000 [IU] | ORAL_CAPSULE | ORAL | 10 refills | Status: DC

## 2017-12-19 MED ORDER — LEVOTHYROXINE SODIUM 25 MCG PO TABS: 25.0000 ug | ORAL_TABLET | Freq: Every day | ORAL | 3 refills | Status: DC
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
## 2017-12-19 NOTE — Patient Instructions (Addendum)
Follow-up 6 weeks for recheck TSH, free T4 and free T3.  Then 6 months follow-up office visit with me.  At that time we can recheck vitamin D levels, as well as total bilirubin direct and indirect levels to ensure stability of lab values

## 2018-01-09 ENCOUNTER — Other Ambulatory Visit: Payer: Self-pay | Admitting: Sports Medicine

## 2018-01-09 DIAGNOSIS — M5440 Lumbago with sciatica, unspecified side: Principal | ICD-10-CM

## 2018-01-09 DIAGNOSIS — G8929 Other chronic pain: Secondary | ICD-10-CM

## 2018-01-09 NOTE — Telephone Encounter (Signed)
Last OV 12/11/2017, last refill: 4/12 #60/2. OK to refill?

## 2018-01-11 ENCOUNTER — Telehealth: Payer: Self-pay | Admitting: Sports Medicine

## 2018-01-11 NOTE — Telephone Encounter (Signed)
Spoke with Junie Panning at [facility] and she states that a refill request was sent on 01/09/18 but they have not gotten any information; will route to office for provider review.  pregabalin refill Last Refill: 09/22/17 /#60 Last OV: 12/11/17 PCP: Dr Teresa Coombs Pharmacy: [facility]

## 2018-01-11 NOTE — Telephone Encounter (Signed)
Rx refilled, called pt and advised.  

## 2018-01-11 NOTE — Telephone Encounter (Signed)
Copied from [place] [id number]. Topic: Inquiry >> Jan 11, 2018  8:35 AM Pricilla Handler wrote: Reason for CRM: Patient called requesting a refill of Pregabalin (LYRICA) 75 MG capsule. Patient's preferred pharmacy is the [place], [place] - [address] [phone] (Phone) [phone] (Fax).       Thank You!!!

## 2018-01-15 ENCOUNTER — Encounter: Payer: Self-pay | Admitting: Sports Medicine

## 2018-02-05 ENCOUNTER — Ambulatory Visit (INDEPENDENT_AMBULATORY_CARE_PROVIDER_SITE_OTHER): Payer: 59 | Admitting: Psychology

## 2018-02-05 DIAGNOSIS — F411 Generalized anxiety disorder: Secondary | ICD-10-CM | POA: Diagnosis not present

## 2018-02-07 ENCOUNTER — Encounter: Payer: Self-pay | Admitting: Sports Medicine

## 2018-02-07 ENCOUNTER — Other Ambulatory Visit: Payer: Self-pay

## 2018-02-07 DIAGNOSIS — R7989 Other specified abnormal findings of blood chemistry: Secondary | ICD-10-CM

## 2018-02-07 DIAGNOSIS — E038 Other specified hypothyroidism: Secondary | ICD-10-CM

## 2018-02-07 DIAGNOSIS — E039 Hypothyroidism, unspecified: Secondary | ICD-10-CM

## 2018-02-08 ENCOUNTER — Other Ambulatory Visit: Payer: 59

## 2018-02-08 DIAGNOSIS — E039 Hypothyroidism, unspecified: Secondary | ICD-10-CM

## 2018-02-08 DIAGNOSIS — E038 Other specified hypothyroidism: Secondary | ICD-10-CM

## 2018-02-08 DIAGNOSIS — R7989 Other specified abnormal findings of blood chemistry: Secondary | ICD-10-CM

## 2018-02-09 LAB — TSH: TSH: 4.79 u[IU]/mL — ABNORMAL HIGH (ref 0.450–4.500)

## 2018-02-13 ENCOUNTER — Encounter: Payer: Self-pay | Admitting: Family Medicine

## 2018-02-15 LAB — SPECIMEN STATUS REPORT

## 2018-02-15 LAB — T3, FREE: T3 FREE: 2.8 pg/mL (ref 2.0–4.4)

## 2018-02-15 LAB — T4, FREE: FREE T4: 1.43 ng/dL (ref 0.82–1.77)

## 2018-02-25 ENCOUNTER — Encounter: Payer: Self-pay | Admitting: Sports Medicine
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
## 2018-03-06 ENCOUNTER — Ambulatory Visit (INDEPENDENT_AMBULATORY_CARE_PROVIDER_SITE_OTHER): Payer: 59 | Admitting: Psychology

## 2018-03-06 DIAGNOSIS — F411 Generalized anxiety disorder: Secondary | ICD-10-CM | POA: Diagnosis not present

## 2018-03-09 ENCOUNTER — Encounter: Payer: Self-pay | Admitting: Sports Medicine

## 2018-03-23 ENCOUNTER — Ambulatory Visit (INDEPENDENT_AMBULATORY_CARE_PROVIDER_SITE_OTHER): Payer: 59 | Admitting: Psychology

## 2018-03-23 DIAGNOSIS — F411 Generalized anxiety disorder: Secondary | ICD-10-CM | POA: Diagnosis not present

## 2018-03-28 ENCOUNTER — Encounter: Payer: Self-pay | Admitting: Sports Medicine

## 2018-04-10 ENCOUNTER — Telehealth: Payer: Self-pay

## 2018-04-10 ENCOUNTER — Ambulatory Visit: Payer: Self-pay | Admitting: *Deleted

## 2018-04-10 NOTE — Telephone Encounter (Signed)
Left message for patient to call back to schedule in concussion clinic.  
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
## 2018-04-11 ENCOUNTER — Encounter: Payer: Self-pay | Admitting: Family Medicine

## 2018-04-11 ENCOUNTER — Ambulatory Visit (INDEPENDENT_AMBULATORY_CARE_PROVIDER_SITE_OTHER): Payer: 59 | Admitting: Family Medicine

## 2018-04-11 DIAGNOSIS — S060X0A Concussion without loss of consciousness, initial encounter: Secondary | ICD-10-CM | POA: Diagnosis not present

## 2018-04-11 DIAGNOSIS — S060X9A Concussion with loss of consciousness of unspecified duration, initial encounter: Secondary | ICD-10-CM | POA: Insufficient documentation

## 2018-04-11 DIAGNOSIS — S060XAA Concussion with loss of consciousness status unknown, initial encounter: Secondary | ICD-10-CM | POA: Insufficient documentation

## 2018-04-11 NOTE — Assessment & Plan Note (Addendum)
Mild concussion noted.  Discussed icing regimen and home exercises.  Discussed which activities of doing which wants to avoid.  Patient is to increase activity as tolerated.  Discussed icing regimen.  Discussed with patient about over-the-counter medications that I think will be beneficial.  We discussed the importance of rest when possible.  Patient is going to see how she responds.  Follow-up with me again in 2weeks.

## 2018-04-11 NOTE — Patient Instructions (Signed)
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

## 2018-04-12 ENCOUNTER — Encounter: Payer: Self-pay | Admitting: Family Medicine

## 2018-04-16 ENCOUNTER — Other Ambulatory Visit: Payer: Self-pay

## 2018-04-16 DIAGNOSIS — S060X9S Concussion with loss of consciousness of unspecified duration, sequela: Secondary | ICD-10-CM

## 2018-04-23 ENCOUNTER — Ambulatory Visit (INDEPENDENT_AMBULATORY_CARE_PROVIDER_SITE_OTHER): Payer: 59 | Admitting: Psychology

## 2018-04-23 DIAGNOSIS — F411 Generalized anxiety disorder: Secondary | ICD-10-CM

## 2018-04-24 NOTE — Progress Notes (Signed)
[facility] [place], [place] [address] Phone: [phone] Subjective:     CC:  I, Jacqualin Combes, am serving as a scribe for Dr. Hulan Saas.  [id number]  Sherry Luna is a 32 y.o. female coming in with complaint of head injury. She said that she feels like she is getting worse. Patient is easily confused and cannot recall. Feels fatigued and has increase in anxiety. Headaches are constant with varying intensities. She has been taking all of the supplements recommended. Continues later in the day to have visual disturbance in upper right quadrant of eye with fatigue. Also notes pain over the posterior right aspect of head. Patient notes that she has not thrown up today but had been vomiting recently after eating. Does feel like she has had a significant decrease in her caloric intake due to a decrease in appetite.    Has been working since last visit but has had to cut back on work. Has spaced clients out to help with symptoms.   Has tried to ride trainer for 15 minutes and had increase in symptoms so stopped activity. Did schedule [facility] for December.   Objective  Symptoms include:  Headache: 3 Nausea: 3 Dizziness: 2 Vomiting: 0 Balance problems: 1 Trouble falling asleep: 4 Fatigue: 4 Sleeping less than usual: 0 Drowsiness: 3 Sleeping more than usual: 6 Sensitivity to light: 5 Sensitivity to noise: 5 Irritable: 3 Sad: 2 Numbness: 0 Nervous: 2 Emotional: 2 Mentally foggy: 4 Feeling slowed down: 2 Memory problems: 5 Difficulty concentrating: 4 Vision problems: 0   Total symptom score: 60 Previous symptoms score: 52  Impact scores: Memory composite verbal 85 (54) Memory composite visual 33 (3) Visual motor speed composite 29.25 (15) Reaction time composite .77 (10%)     No past medical history on file. Past Surgical History:  Procedure Laterality Date  . APPENDECTOMY    . CHOLECYSTECTOMY     . FRACTURE SURGERY     femur  . LIPOMA EXCISION Right 11/26/2015   Procedure: EXCISION POSTERIOR NECK LIPOMA;  Surgeon: [name], DO;  Location: [facility];  Service: Plastics;  Laterality: Right;  . LUMBAR MICRODISCECTOMY Right    Dr. [name]   Social History   Socioeconomic History  . Marital status: Married    Spouse name: Not on file  . Number of children: Not on file  . Years of education: Not on file  . Highest education level: Not on file  Occupational History  . Not on file  Social Needs  . Financial resource strain: Not on file  . Food insecurity:    Worry: Not on file    Inability: Not on file  . Transportation needs:    Medical: Not on file    Non-medical: Not on file  Tobacco Use  . Smoking status: Never Smoker  . Smokeless tobacco: Never Used  Substance and Sexual Activity  . Alcohol use: Yes    Comment: occas  . Drug use: No  . Sexual activity: Yes    Birth control/protection: IUD  Lifestyle  . Physical activity:    Days per week: Not on file    Minutes per session: Not on file  . Stress: Not on file  Relationships  . Social connections:    Talks on phone: Not on file    Gets together: Not on file    Attends religious service: Not on file    Active  member of club or organization: Not on file    Attends meetings of clubs or organizations: Not on file    Relationship status: Not on file  Other Topics Concern  . Not on file  Social History Narrative  . Not on file   Allergies  Allergen Reactions  . Ceftin [Cefuroxime Axetil] Shortness Of Breath  . Ciprofloxacin Shortness Of Breath  . Aspirin Nausea Only  . Doxycycline Nausea Only  . Zithromax [Azithromycin] Swelling   No family history on file.  Current Outpatient Medications (Endocrine & Metabolic):  .  levonorgestrel (MIRENA) 20 MCG/24HR IUD, 1 each by Intrauterine route once.   Current Outpatient Medications (Respiratory):  .  cetirizine (ZYRTEC) 10 MG  tablet, Take 10 mg by mouth daily.  Current Outpatient Medications (Analgesics):  .  acetaminophen (TYLENOL) 325 MG tablet, Take 500 mg by mouth every 6 (six) hours as needed.    Current Outpatient Medications (Other):  Marland Kitchen  B COMPLEX-C PO, Take 1,500 mg by mouth. .  calcium carbonate (OSCAL) 1500 (600 Ca) MG TABS tablet, Take 2 (two) times daily by mouth. Marland Kitchen  LORazepam (ATIVAN) 0.5 MG tablet, Take 1 tablet (0.5 mg total) by mouth every 8 (eight) hours as needed for anxiety. Marland Kitchen  LYRICA 75 MG capsule, TAKE 1 CAPSULE BY MOUTH TWICE DAILY .  MELATONIN PO, Take 1 tablet by mouth daily. .  Omega-3 Fatty Acids (FISH OIL) 1000 MG CAPS, Take 2,800 mg by mouth daily.  .  Vitamin D, Ergocalciferol, (DRISDOL) 50000 units CAPS capsule, Take 1 capsule (50,000 Units total) by mouth every 7 (seven) days. Marland Kitchen  zolpidem (AMBIEN) 5 MG tablet, Take 1-2 tablets (5-10 mg total) by mouth at bedtime as needed for sleep. Marland Kitchen  venlafaxine XR (EFFEXOR XR) 37.5 MG 24 hr capsule, Take 1 capsule (37.5 mg total) by mouth daily with breakfast.    Past medical history, social, surgical and family history all reviewed in electronic medical record.  No pertanent information unless stated regarding to the chief complaint.   Review of Systems:  No  nausea, vomiting, diarrhea, constipation, dizziness, abdominal pain, skin rash, fevers, chills, night sweats, weight loss, swollen lymph nodes, , chest pain, shortness of breath, mood changes.  Positive muscle aches, headaches, visual changes  Objective  Blood pressure 112/76, pulse 72, height 5\' 5"  (1.651 m), weight 196 lb (88.9 kg), SpO2 99 %.    General: No apparent distress alert and oriented x3 mood and affect normal, dressed appropriately.  HEENT: Pupils equal, extraocular movements intact no nystagmus noted.  Patient actually appears better than previous exam Respiratory: Patient's speak in full sentences and does not appear short of breath  Cardiovascular: No lower extremity  edema, non tender, no erythema  Skin: Warm dry intact with no signs of infection or rash on extremities or on axial skeleton.  Abdomen: Soft nontender  Neuro: Cranial nerves II through XII are intact, neurovascularly intact in all extremities with 2+ DTRs and 2+ pulses.  Lymph: No lymphadenopathy of posterior or anterior cervical chain or axillae bilaterally.  Gait normal with good balance and coordination.  MSK:  Non tender with full range of motion and good stability and symmetric strength and tone of shoulders, elbows, wrist, hip, knee and ankles bilaterally.      Impression and Recommendations:     This case required medical decision making of moderate complexity. The above documentation has been reviewed and is accurate and complete Lyndal Pulley, DO       Note:  This dictation was prepared with Dragon dictation along with smaller phrase technology. Any transcriptional errors that result from this process are unintentional.

## 2018-04-25 ENCOUNTER — Encounter: Payer: Self-pay | Admitting: Family Medicine

## 2018-04-25 ENCOUNTER — Other Ambulatory Visit (INDEPENDENT_AMBULATORY_CARE_PROVIDER_SITE_OTHER): Payer: 59

## 2018-04-25 ENCOUNTER — Ambulatory Visit: Payer: 59 | Admitting: Family Medicine

## 2018-04-25 VITALS — BP 112/76 | HR 72 | Ht 65.0 in | Wt 196.0 lb

## 2018-04-25 DIAGNOSIS — S060X0D Concussion without loss of consciousness, subsequent encounter: Secondary | ICD-10-CM

## 2018-04-25 DIAGNOSIS — M2550 Pain in unspecified joint: Secondary | ICD-10-CM

## 2018-04-25 LAB — COMPREHENSIVE METABOLIC PANEL
ALT: 12 U/L (ref 0–35)
AST: 16 U/L (ref 0–37)
Albumin: 4.9 g/dL (ref 3.5–5.2)
Alkaline Phosphatase: 56 U/L (ref 39–117)
BUN: 16 mg/dL (ref 6–23)
CHLORIDE: 103 meq/L (ref 96–112)
CO2: 31 mEq/L (ref 19–32)
Calcium: 10.1 mg/dL (ref 8.4–10.5)
Creatinine, Ser: 0.77 mg/dL (ref 0.40–1.20)
GFR: 92.73 mL/min (ref >60.00)
GLUCOSE: 90 mg/dL (ref 70–99)
POTASSIUM: 4.6 meq/L (ref 3.5–5.1)
SODIUM: 140 meq/L (ref 135–145)
Total Bilirubin: 2.1 mg/dL — ABNORMAL HIGH (ref 0.2–1.2)
Total Protein: 7.6 g/dL (ref 6.0–8.3)

## 2018-04-25 LAB — CBC WITH DIFFERENTIAL/PLATELET
BASOS PCT: 1.6 % (ref 0.0–3.0)
Basophils Absolute: 0.1 10*3/uL (ref 0.0–0.1)
EOS PCT: 0.5 % (ref 0.0–5.0)
Eosinophils Absolute: 0 10*3/uL (ref 0.0–0.7)
HCT: 42.1 % (ref 36.0–46.0)
Hemoglobin: 14.6 g/dL (ref 12.0–15.0)
Lymphocytes Relative: 40.6 % (ref 12.0–46.0)
Lymphs Abs: 2.3 10*3/uL (ref 0.7–4.0)
MCHC: 34.7 g/dL (ref 30.0–36.0)
MCV: 97.4 fl (ref 78.0–100.0)
MONO ABS: 0.3 10*3/uL (ref 0.1–1.0)
Monocytes Relative: 6 % (ref 3.0–12.0)
NEUTROS PCT: 51.3 % (ref 43.0–77.0)
Neutro Abs: 2.8 10*3/uL (ref 1.4–7.7)
Platelets: 253 10*3/uL (ref 150.0–400.0)
RBC: 4.32 Mil/uL (ref 3.87–5.11)
RDW: 12.2 % (ref 11.5–15.5)
WBC: 5.5 10*3/uL (ref 4.0–10.5)

## 2018-04-25 LAB — IBC PANEL
Iron: 191 ug/dL — ABNORMAL HIGH (ref 42–145)
Saturation Ratios: 50.3 % — ABNORMAL HIGH (ref 20.0–50.0)
Transferrin: 271 mg/dL (ref 212.0–360.0)

## 2018-04-25 LAB — SEDIMENTATION RATE: SED RATE: 5 mm/h (ref 0–20)

## 2018-04-25 LAB — VITAMIN D 25 HYDROXY (VIT D DEFICIENCY, FRACTURES): VITD: 39.7 ng/mL (ref 30.00–100.00)

## 2018-04-25 LAB — FERRITIN: FERRITIN: 73.5 ng/mL (ref 10.0–291.0)

## 2018-04-25 MED ORDER — VENLAFAXINE HCL ER 37.5 MG PO CP24: 37.5000 mg | ORAL_CAPSULE | Freq: Every day | ORAL | 1 refills | Status: DC

## 2018-04-25 NOTE — Patient Instructions (Addendum)
Good to see you  Continue the vitamins Effexor 37.5 mg daily to help a little with mood, pain and cognitioin.  We will get labs to make sure everything is ok and not slowing [place] down Limit to 6 hours days starting next week I am ok for you to try the trainer See me again in 2-3 weeks

## 2018-04-25 NOTE — Assessment & Plan Note (Signed)
Patient in the interval did not make significant improvement.  Test were little bit better but not severe.  Discussed laboratory work-up to rule out other possibilities that is contributing to the slow healing at this time.  We discussed the possibility of a CT scan of the head but it would not likely change our management.  Do not feel that further testing for other things would be beneficial.  Started Effexor with patient having underlying anxiety and depression that could be contributing.  Patient is worsening symptoms will call us.  Follow-up with me again in 3 to 4 weeks

## 2018-04-27 LAB — PTH, INTACT AND CALCIUM
CALCIUM: 10 mg/dL (ref 8.6–10.2)
PTH: 23 pg/mL (ref 14–64)

## 2018-04-27 LAB — CALCIUM, IONIZED: Calcium, Ion: 5.56 mg/dL (ref 4.8–5.6)

## 2018-04-27 LAB — PREGNANCY, URINE: PREG TEST UR: NEGATIVE

## 2018-04-30 DIAGNOSIS — H5310 Unspecified subjective visual disturbances: Secondary | ICD-10-CM | POA: Diagnosis not present

## 2018-04-30 DIAGNOSIS — F0781 Postconcussional syndrome: Secondary | ICD-10-CM | POA: Diagnosis not present

## 2018-04-30 DIAGNOSIS — H53133 Sudden visual loss, bilateral: Secondary | ICD-10-CM | POA: Diagnosis not present

## 2018-04-30 DIAGNOSIS — H5315 Visual distortions of shape and size: Secondary | ICD-10-CM | POA: Diagnosis not present

## 2018-04-30 DIAGNOSIS — F810 Specific reading disorder: Secondary | ICD-10-CM | POA: Diagnosis not present

## 2018-04-30 DIAGNOSIS — H532 Diplopia: Secondary | ICD-10-CM | POA: Diagnosis not present

## 2018-04-30 DIAGNOSIS — H538 Other visual disturbances: Secondary | ICD-10-CM | POA: Diagnosis not present

## 2018-04-30 DIAGNOSIS — H539 Unspecified visual disturbance: Secondary | ICD-10-CM | POA: Diagnosis not present

## 2018-04-30 DIAGNOSIS — H53143 Visual discomfort, bilateral: Secondary | ICD-10-CM | POA: Diagnosis not present

## 2018-05-07 DIAGNOSIS — G478 Other sleep disorders: Secondary | ICD-10-CM | POA: Diagnosis not present

## 2018-05-07 DIAGNOSIS — R630 Anorexia: Secondary | ICD-10-CM | POA: Diagnosis not present

## 2018-05-07 DIAGNOSIS — S060X9S Concussion with loss of consciousness of unspecified duration, sequela: Secondary | ICD-10-CM | POA: Diagnosis not present

## 2018-05-07 DIAGNOSIS — R110 Nausea: Secondary | ICD-10-CM | POA: Diagnosis not present

## 2018-05-07 DIAGNOSIS — R5383 Other fatigue: Secondary | ICD-10-CM | POA: Diagnosis not present

## 2018-05-07 DIAGNOSIS — G44309 Post-traumatic headache, unspecified, not intractable: Secondary | ICD-10-CM | POA: Diagnosis not present

## 2018-05-07 DIAGNOSIS — H538 Other visual disturbances: Secondary | ICD-10-CM | POA: Diagnosis not present

## 2018-05-09 ENCOUNTER — Ambulatory Visit: Payer: Self-pay | Admitting: Family Medicine

## 2018-05-31 ENCOUNTER — Ambulatory Visit (INDEPENDENT_AMBULATORY_CARE_PROVIDER_SITE_OTHER): Payer: 59 | Admitting: Family Medicine

## 2018-05-31 DIAGNOSIS — S060X0D Concussion without loss of consciousness, subsequent encounter: Secondary | ICD-10-CM | POA: Diagnosis not present

## 2018-05-31 NOTE — Assessment & Plan Note (Signed)
Overall nearly completely resolved at this time.  We discussed with patient to continue the medications at this time but will probably discontinue in the next month.  Patient will go with the vestibular neuro that was recommended by another specialist.  Patient will send me a message in 6 weeks to give me an update.  As long as patient is completely back to her baseline I would say that patient can follow-up as needed thereafter.

## 2018-05-31 NOTE — Patient Instructions (Signed)
Iam so happy you are doing better I am ok with a very light bike ride.  Try to have handle bars up  Continue all meds for now until you are your self Love the idea of pT  Write me again early February and tell me how you are doing Happy holidays!
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
## 2018-06-04 DIAGNOSIS — S060X0D Concussion without loss of consciousness, subsequent encounter: Secondary | ICD-10-CM | POA: Diagnosis not present

## 2018-06-18 ENCOUNTER — Other Ambulatory Visit: Payer: Self-pay | Admitting: Family Medicine

## 2018-06-19 ENCOUNTER — Ambulatory Visit (INDEPENDENT_AMBULATORY_CARE_PROVIDER_SITE_OTHER): Payer: 59 | Admitting: Psychology

## 2018-06-19 DIAGNOSIS — F411 Generalized anxiety disorder: Secondary | ICD-10-CM

## 2018-06-20 DIAGNOSIS — S060X0D Concussion without loss of consciousness, subsequent encounter: Secondary | ICD-10-CM | POA: Diagnosis not present

## 2018-06-21 ENCOUNTER — Encounter: Payer: Self-pay | Admitting: Sports Medicine

## 2018-07-09 ENCOUNTER — Encounter: Payer: Self-pay | Admitting: Family Medicine

## 2018-07-09 DIAGNOSIS — F0781 Postconcussional syndrome: Secondary | ICD-10-CM | POA: Diagnosis not present

## 2018-07-09 DIAGNOSIS — M7918 Myalgia, other site: Secondary | ICD-10-CM | POA: Diagnosis not present

## 2018-07-10 ENCOUNTER — Encounter: Payer: Self-pay | Admitting: Sports Medicine

## 2018-07-18 DIAGNOSIS — F4323 Adjustment disorder with mixed anxiety and depressed mood: Secondary | ICD-10-CM | POA: Diagnosis not present

## 2018-08-02 DIAGNOSIS — F4323 Adjustment disorder with mixed anxiety and depressed mood: Secondary | ICD-10-CM | POA: Diagnosis not present

## 2018-08-16 DIAGNOSIS — Z124 Encounter for screening for malignant neoplasm of cervix: Secondary | ICD-10-CM | POA: Diagnosis not present

## 2018-08-16 DIAGNOSIS — Z01419 Encounter for gynecological examination (general) (routine) without abnormal findings: Secondary | ICD-10-CM | POA: Diagnosis not present

## 2018-08-17 ENCOUNTER — Ambulatory Visit: Payer: Self-pay | Admitting: Sports Medicine

## 2018-08-21 ENCOUNTER — Ambulatory Visit: Payer: 59 | Admitting: Sports Medicine

## 2018-08-21 ENCOUNTER — Other Ambulatory Visit: Payer: Self-pay | Admitting: Family Medicine

## 2018-08-23 ENCOUNTER — Ambulatory Visit: Payer: 59 | Admitting: Sports Medicine

## 2018-08-23 ENCOUNTER — Encounter: Payer: Self-pay | Admitting: Sports Medicine

## 2018-08-23 ENCOUNTER — Other Ambulatory Visit: Payer: Self-pay

## 2018-08-23 VITALS — BP 110/70 | HR 66 | Ht 65.0 in | Wt 195.4 lb

## 2018-08-23 DIAGNOSIS — M9901 Segmental and somatic dysfunction of cervical region: Secondary | ICD-10-CM | POA: Diagnosis not present

## 2018-08-23 DIAGNOSIS — M9902 Segmental and somatic dysfunction of thoracic region: Secondary | ICD-10-CM | POA: Diagnosis not present

## 2018-08-23 DIAGNOSIS — M9908 Segmental and somatic dysfunction of rib cage: Secondary | ICD-10-CM

## 2018-08-23 DIAGNOSIS — M25512 Pain in left shoulder: Secondary | ICD-10-CM | POA: Diagnosis not present

## 2018-08-23 DIAGNOSIS — M25511 Pain in right shoulder: Secondary | ICD-10-CM

## 2018-08-26 ENCOUNTER — Encounter: Payer: Self-pay | Admitting: Sports Medicine
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
## 2018-09-06 ENCOUNTER — Other Ambulatory Visit: Payer: Self-pay

## 2018-09-06 ENCOUNTER — Encounter: Payer: Self-pay | Admitting: Family Medicine

## 2018-09-07 ENCOUNTER — Encounter: Payer: Self-pay | Admitting: Family Medicine

## 2018-09-27 ENCOUNTER — Encounter: Payer: Self-pay | Admitting: Family Medicine

## 2018-09-27 MED ORDER — VENLAFAXINE HCL ER 37.5 MG PO CP24: 37.5000 mg | ORAL_CAPSULE | Freq: Every day | ORAL | 1 refills | Status: DC

## 2018-10-04 DIAGNOSIS — N3941 Urge incontinence: Secondary | ICD-10-CM | POA: Diagnosis not present

## 2018-10-15 ENCOUNTER — Other Ambulatory Visit: Payer: Self-pay

## 2018-10-15 ENCOUNTER — Ambulatory Visit (INDEPENDENT_AMBULATORY_CARE_PROVIDER_SITE_OTHER): Payer: 59 | Admitting: Family Medicine

## 2018-10-15 ENCOUNTER — Ambulatory Visit: Payer: 59 | Admitting: Family Medicine

## 2018-10-15 ENCOUNTER — Encounter: Payer: Self-pay | Admitting: Family Medicine

## 2018-10-15 VITALS — BP 107/58 | HR 52 | Ht 65.0 in

## 2018-10-15 DIAGNOSIS — E559 Vitamin D deficiency, unspecified: Secondary | ICD-10-CM

## 2018-10-15 DIAGNOSIS — E039 Hypothyroidism, unspecified: Secondary | ICD-10-CM

## 2018-10-15 DIAGNOSIS — J3089 Other allergic rhinitis: Secondary | ICD-10-CM

## 2018-10-15 DIAGNOSIS — E038 Other specified hypothyroidism: Secondary | ICD-10-CM

## 2018-10-15 DIAGNOSIS — Z87820 Personal history of traumatic brain injury: Secondary | ICD-10-CM
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
## 2018-10-16 ENCOUNTER — Encounter: Payer: Self-pay | Admitting: Sports Medicine

## 2018-11-07 DIAGNOSIS — N921 Excessive and frequent menstruation with irregular cycle: Secondary | ICD-10-CM | POA: Diagnosis not present

## 2018-11-07 DIAGNOSIS — R3915 Urgency of urination: Secondary | ICD-10-CM | POA: Diagnosis not present

## 2018-11-07 DIAGNOSIS — Z30432 Encounter for removal of intrauterine contraceptive device: Secondary | ICD-10-CM | POA: Diagnosis not present

## 2018-11-26 ENCOUNTER — Ambulatory Visit (INDEPENDENT_AMBULATORY_CARE_PROVIDER_SITE_OTHER): Payer: 59 | Admitting: Family Medicine

## 2018-11-26 ENCOUNTER — Encounter: Payer: Self-pay | Admitting: Family Medicine

## 2018-11-26 ENCOUNTER — Other Ambulatory Visit: Payer: Self-pay

## 2018-11-26 VITALS — BP 105/69 | HR 60 | Temp 98.6°F | Ht 65.5 in | Wt 192.9 lb

## 2018-11-26 DIAGNOSIS — Z0000 Encounter for general adult medical examination without abnormal findings: Secondary | ICD-10-CM | POA: Diagnosis not present

## 2018-11-26 DIAGNOSIS — Z7189 Other specified counseling: Secondary | ICD-10-CM | POA: Diagnosis not present

## 2018-11-26 NOTE — Progress Notes (Signed)
Pt is here for yearly physical. Having fasting labs drawn today. Pt didn't mention any concerns at visit with me before provider.

## 2018-11-26 NOTE — Progress Notes (Signed)
Impression and Recommendations:    1. Routine physical examination   2. Counseling on health promotion and disease prevention     1) Anticipatory Guidance: Discussed importance of wearing sunscreen when outside along with yearly skin surveillance; eating a well balanced and modest diet; physical activity at least 25 minutes per day or 150 min/ week of moderate to intense activity.  2) Immunizations / Screenings / Labs:  All immunizations and screenings that patient agrees to, are up-to-date per recommendations or will be updated today.  -  Patient understands the need for q 50mo dental and yearly vision screens which pt will schedule independently.  -  Obtain CBC, CMP, HgA1c, Lipid panel, TSH, F T4 and vit D when fasting if not already done recently.   3) Weight:   Pt lives very healthy lifestyle with over 300 -450 min/ wk of exercise etc.  Explained to pt that health is more than just a number on a scale.   We will obtain labs, BP and P look great.   -Patient explains to me today that she has a significant history of eating disorder.  She has struggled with this for many years in her life.  She currently also does see a counselor once weekly and a dieticina/ counselor that helps her deal with her mood about food.   Orders Placed This Encounter  Procedures  . CBC With Differential  . Comprehensive metabolic panel  . Hemoglobin A1c  . Lipid panel  . TSH  . Vitamin D 1,25 dihydroxy  . T4, free    Gross side effects, risk and benefits, and alternatives of medications discussed with patient.  Patient is aware that all medications have potential side effects and we are unable to predict every side effect or drug-drug interaction that may occur.  Expresses verbal understanding and consents to current therapy plan and treatment regimen.  F-up preventative CPE in 1 year. F/up 78mo- chronic conditions  Please see orders placed and AVS handed out to patient at the end of our visit for  further patient instructions/ counseling done pertaining to today's office visit.   Mellody Dance, DO 11/26/18 4:45PM   Subjective:    Chief Complaint  Patient presents with  . Annual Exam    HPI: Sherry Luna is a 33 y.o. female who presents to [facility] today a yearly health maintenance exam.  Health Maintenance Summary Reviewed and updated, unless pt declines services.  Tobacco History Reviewed:   Y  Alcohol:    No concerns, no excessive use Exercise Habits:   exceeds AHA guidelines STD concerns:   none Drug Use:   None Female Care:  Per GYN Lumps or breast concerns:      none Breast Cancer Family History:      No   Immunization History  Administered Date(s) Administered  . Influenza,inj,Quad PF,6+ Mos 04/10/2017    Health Maintenance  Topic Date Due  . HIV Screening  12/26/2001  . TETANUS/TDAP  12/26/2005  . PAP SMEAR-Modifier  12/27/2007  . INFLUENZA VACCINE  01/12/2019     Wt Readings from Last 3 Encounters:  11/26/18 192 lb 14.4 oz (87.5 kg)  08/23/18 195 lb 6.4 oz (88.6 kg)  05/31/18 197 lb (89.4 kg)   BP Readings from Last 3 Encounters:  11/26/18 105/69  10/15/18 (!) 107/58  08/23/18 110/70   Pulse Readings from Last 3 Encounters:  11/26/18 60  10/15/18 (!) 52  08/23/18 66  History reviewed. No pertinent past medical history.   Past Surgical History:  Procedure Laterality Date  . APPENDECTOMY    . CHOLECYSTECTOMY    . FRACTURE SURGERY     femur  . LIPOMA EXCISION Right 11/26/2015   Procedure: EXCISION POSTERIOR NECK LIPOMA;  Surgeon: [name], DO;  Location: [facility];  Service: Plastics;  Laterality: Right;  . LUMBAR MICRODISCECTOMY Right    Dr. [name]    History reviewed. No pertinent family history.   Social History   Substance and Sexual Activity  Drug Use No  ,   Social History   Substance and Sexual Activity  Alcohol Use Yes   Comment: occas  ,    Social History   Tobacco Use  Smoking Status Never Smoker  Smokeless Tobacco Never Used  ,   Social History   Substance and Sexual Activity  Sexual Activity Yes  . Birth control/protection: I.U.D.    Current Outpatient Medications on File Prior to Visit  Medication Sig Dispense Refill  . B COMPLEX-C PO Take 1,500 mg by mouth.    . calcium carbonate (OSCAL) 1500 (600 Ca) MG TABS tablet Take 2 (two) times daily by mouth.    . cetirizine (ZYRTEC) 10 MG tablet Take 10 mg by mouth daily.    Marland Kitchen doxylamine, Sleep, (UNISOM) 25 MG tablet Take 25 mg by mouth at bedtime as needed.    . folic acid (FOLVITE) 353 MCG tablet Take 1,200 mcg by mouth daily.     . Omega-3 Fatty Acids (FISH OIL) 1000 MG CAPS Take 3,000 mg by mouth daily.     Marland Kitchen venlafaxine XR (EFFEXOR-XR) 37.5 MG 24 hr capsule Take 1 capsule (37.5 mg total) by mouth daily with breakfast. 90 capsule 1   No current facility-administered medications on file prior to visit.     Allergies: Ceftin [cefuroxime axetil], Ciprofloxacin, Aspirin, Doxycycline, and Zithromax [azithromycin]  Review of Systems: General:   Denies fever, chills, unexplained weight loss.  Optho/Auditory:   Denies visual changes, blurred vision/LOV Respiratory:   Denies SOB, DOE more than baseline levels.  Cardiovascular:   Denies chest pain, palpitations, new onset peripheral edema  Gastrointestinal:   Denies nausea, vomiting, diarrhea.  Genitourinary: Denies dysuria, freq/ urgency, flank pain or discharge from genitals.  Endocrine:     Denies hot or cold intolerance, polyuria, polydipsia. Musculoskeletal:   Denies unexplained myalgias, joint swelling, unexplained arthralgias, gait problems.  Skin:  Denies rash, suspicious lesions Neurological:     Denies dizziness, unexplained weakness, numbness  Psychiatric/Behavioral:   Denies mood changes, suicidal or homicidal ideations, hallucinations   Objective:    Blood pressure 105/69, pulse 60, temperature 98.6 F  (37 C), temperature source Oral, height 5' 5.5" (1.664 m), weight 192 lb 14.4 oz (87.5 kg), last menstrual period 11/16/2018, SpO2 100 %. Body mass index is 31.61 kg/m. General Appearance:    Alert, cooperative, no distress, appears stated age  Head:    Normocephalic, without obvious abnormality, atraumatic  Eyes:    PERRL, conjunctiva/corneas clear, EOM's intact, fundi    benign, both eyes  Ears:    Normal TM's and external ear canals, both ears  Nose:   Nares normal, septum midline, mucosa normal, no drainage    or sinus tenderness  Throat:   Lips w/o lesion, mucosa moist, and tongue normal; teeth and   gums normal  Neck:   Supple, symmetrical, trachea midline, no adenopathy;    thyroid:  no enlargement/tenderness/nodules  Back:  Symmetric, no curvature, ROM normal, no CVA tenderness  Lungs:     Clear to auscultation bilaterally, respirations unlabored, no       Wh/ R/ R  Chest Wall:    No tenderness or gross deformity; normal excursion   Heart:    Regular rate and rhythm, S1 and S2 normal, no murmur, rub   or gallop  Breast Exam:    Not done- deferred to GYN  Abdomen:     Soft, non-tender, bowel sounds active all four quadrants, NO   G/R/R, no masses, no organomegaly  Genitalia:    Deferred to GYN   Rectal:    Not done  Extremities:   Extremities normal, atraumatic, no cyanosis or gross edema  Pulses:   2+ and symmetric all extremities  Skin:   Warm, dry, Skin color, texture, turgor normal, no obvious rashes or lesions Psych: No HI/SI, judgement and insight good, Euthymic mood. Full Affect.  Neurologic:   CNII-XII intact, normal strength, sensation and reflexes    Throughout

## 2018-11-26 NOTE — Patient Instructions (Signed)
Preventive Care for Adults, Female  A healthy lifestyle and preventive care can promote health and wellness. Preventive health guidelines for women include the following key practices.   A routine yearly physical is a good way to check with your health care provider about your health and preventive screening. It is a chance to share any concerns and updates on your health and to receive a thorough exam.   Visit your dentist for a routine exam and preventive care every 6 months. Brush your teeth twice a day and floss once a day. Good oral hygiene prevents tooth decay and gum disease.   The frequency of eye exams is based on your age, health, family medical history, use of contact lenses, and other factors. Follow your health care provider's recommendations for frequency of eye exams.   Eat a healthy diet. Foods like vegetables, fruits, whole grains, low-fat dairy products, and lean protein foods contain the nutrients you need without too many calories. Decrease your intake of foods high in solid fats, added sugars, and salt. Eat the right amount of calories for you.Get information about a proper diet from your health care provider, if necessary.   Regular physical exercise is one of the most important things you can do for your health. Most adults should get at least 150 minutes of moderate-intensity exercise (any activity that increases your heart rate and causes you to sweat) each week. In addition, most adults need muscle-strengthening exercises on 2 or more days a week.   Maintain a healthy weight. The body mass index (BMI) is a screening tool to identify possible weight problems. It provides an estimate of body fat based on height and weight. Your health care provider can find your BMI, and can help you achieve or maintain a healthy weight.For adults 20 years and older:   - A BMI below 18.5 is considered underweight.   - A BMI of 18.5 to 24.9 is normal.   - A BMI of 25 to 29.9 is  considered overweight.   - A BMI of 30 and above is considered obese.   Maintain normal blood lipids and cholesterol levels by exercising and minimizing your intake of trans and saturated fats.  Eat a balanced diet with plenty of fruit and vegetables. Blood tests for lipids and cholesterol should begin at age 69 and be repeated every 5 years minimum.  If your lipid or cholesterol levels are high, you are over 40, or you are at high risk for heart disease, you may need your cholesterol levels checked more frequently.Ongoing high lipid and cholesterol levels should be treated with medicines if diet and exercise are not working.   If you smoke, find out from your health care provider how to quit. If you do not use tobacco, do not start.   Lung cancer screening is recommended for adults aged 45-80 years who are at high risk for developing lung cancer because of a history of smoking. A yearly low-dose CT scan of the lungs is recommended for people who have at least a 30-pack-year history of smoking and are a current smoker or have quit within the past 15 years. A pack year of smoking is smoking an average of 1 pack of cigarettes a day for 1 year (for example: 1 pack a day for 30 years or 2 packs a day for 15 years). Yearly screening should continue until the smoker has stopped smoking for at least 15 years. Yearly screening should be stopped for people who develop a  health problem that would prevent them from having lung cancer treatment.   If you are pregnant, do not drink alcohol. If you are breastfeeding, be very cautious about drinking alcohol. If you are not pregnant and choose to drink alcohol, do not have more than 1 drink per day. One drink is considered to be 12 ounces (355 mL) of beer, 5 ounces (148 mL) of wine, or 1.5 ounces (44 mL) of liquor.   Avoid use of street drugs. Do not share needles with anyone. Ask for help if you need support or instructions about stopping the use of  drugs.   High blood pressure causes heart disease and increases the risk of stroke. Your blood pressure should be checked at least yearly.  Ongoing high blood pressure should be treated with medicines if weight loss and exercise do not work.   If you are 69-55 years old, ask your health care provider if you should take aspirin to prevent strokes.   Diabetes screening involves taking a blood sample to check your fasting blood sugar level. This should be done once every 3 years, after age 38, if you are within normal weight and without risk factors for diabetes. Testing should be considered at a younger age or be carried out more frequently if you are overweight and have at least 1 risk factor for diabetes.   Breast cancer screening is essential preventive care for women. You should practice "breast self-awareness."  This means understanding the normal appearance and feel of your breasts and may include breast self-examination.  Any changes detected, no matter how small, should be reported to a health care provider.  Women in their 80s and 30s should have a clinical breast exam (CBE) by a health care provider as part of a regular health exam every 1 to 3 years.  After age 66, women should have a CBE every year.  Starting at age 1, women should consider having a mammogram (breast X-ray test) every year.  Women who have a family history of breast cancer should talk to their health care provider about genetic screening.  Women at a high risk of breast cancer should talk to their health care providers about having an MRI and a mammogram every year.   -Breast cancer gene (BRCA)-related cancer risk assessment is recommended for women who have family members with BRCA-related cancers. BRCA-related cancers include breast, ovarian, tubal, and peritoneal cancers. Having family members with these cancers may be associated with an increased risk for harmful changes (mutations) in the breast cancer genes BRCA1 and  BRCA2. Results of the assessment will determine the need for genetic counseling and BRCA1 and BRCA2 testing.   The Pap test is a screening test for cervical cancer. A Pap test can show cell changes on the cervix that might become cervical cancer if left untreated. A Pap test is a procedure in which cells are obtained and examined from the lower end of the uterus (cervix).   - Women should have a Pap test starting at age 57.   - Between ages 90 and 70, Pap tests should be repeated every 2 years.   - Beginning at age 63, you should have a Pap test every 3 years as long as the past 3 Pap tests have been normal.   - Some women have medical problems that increase the chance of getting cervical cancer. Talk to your health care provider about these problems. It is especially important to talk to your health care provider if a  new problem develops soon after your last Pap test. In these cases, your health care provider may recommend more frequent screening and Pap tests.   - The above recommendations are the same for women who have or have not gotten the vaccine for human papillomavirus (HPV).   - If you had a hysterectomy for a problem that was not cancer or a condition that could lead to cancer, then you no longer need Pap tests. Even if you no longer need a Pap test, a regular exam is a good idea to make sure no other problems are starting.   - If you are between ages 23 and 41 years, and you have had normal Pap tests going back 10 years, you no longer need Pap tests. Even if you no longer need a Pap test, a regular exam is a good idea to make sure no other problems are starting.   - If you have had past treatment for cervical cancer or a condition that could lead to cancer, you need Pap tests and screening for cancer for at least 20 years after your treatment.   - If Pap tests have been discontinued, risk factors (such as a new sexual partner) need to be reassessed to determine if screening should  be resumed.   - The HPV test is an additional test that may be used for cervical cancer screening. The HPV test looks for the virus that can cause the cell changes on the cervix. The cells collected during the Pap test can be tested for HPV. The HPV test could be used to screen women aged 40 years and older, and should be used in women of any age who have unclear Pap test results. After the age of 38, women should have HPV testing at the same frequency as a Pap test.   Colorectal cancer can be detected and often prevented. Most routine colorectal cancer screening begins at the age of 81 years and continues through age 72 years. However, your health care provider may recommend screening at an earlier age if you have risk factors for colon cancer. On a yearly basis, your health care provider may provide home test kits to check for hidden blood in the stool.  Use of a small camera at the end of a tube, to directly examine the colon (sigmoidoscopy or colonoscopy), can detect the earliest forms of colorectal cancer. Talk to your health care provider about this at age 78, when routine screening begins. Direct exam of the colon should be repeated every 5 -10 years through age 22 years, unless early forms of pre-cancerous polyps or small growths are found.   People who are at an increased risk for hepatitis B should be screened for this virus. You are considered at high risk for hepatitis B if:  -You were born in a country where hepatitis B occurs often. Talk with your health care provider about which countries are considered high risk.  - Your parents were born in a high-risk country and you have not received a shot to protect against hepatitis B (hepatitis B vaccine).  - You have HIV or AIDS.  - You use needles to inject street drugs.  - You live with, or have sex with, someone who has Hepatitis B.  - You get hemodialysis treatment.  - You take certain medicines for conditions like cancer, organ  transplantation, and autoimmune conditions.   Hepatitis C blood testing is recommended for all people born from [birth year] through 1965 and any individual  with known risks for hepatitis C.   Practice safe sex. Use condoms and avoid high-risk sexual practices to reduce the spread of sexually transmitted infections (STIs). STIs include gonorrhea, chlamydia, syphilis, trichomonas, herpes, HPV, and human immunodeficiency virus (HIV). Herpes, HIV, and HPV are viral illnesses that have no cure. They can result in disability, cancer, and death. Sexually active women aged 25 years and younger should be checked for chlamydia. Older women with new or multiple partners should also be tested for chlamydia. Testing for other STIs is recommended if you are sexually active and at increased risk.   Osteoporosis is a disease in which the bones lose minerals and strength with aging. This can result in serious bone fractures or breaks. The risk of osteoporosis can be identified using a bone density scan. Women ages 65 years and over and women at risk for fractures or osteoporosis should discuss screening with their health care providers. Ask your health care provider whether you should take a calcium supplement or vitamin D to There are also several preventive steps women can take to avoid osteoporosis and resulting fractures or to keep osteoporosis from worsening. -->Recommendations include:  Eat a balanced diet high in fruits, vegetables, calcium, and vitamins.  Get enough calcium. The recommended total intake of is 1,200 mg daily; for best absorption, if taking supplements, divide doses into 250-500 mg doses throughout the day. Of the two types of calcium, calcium carbonate is best absorbed when taken with food but calcium citrate can be taken on an empty stomach.  Get enough vitamin D. NAMS and the National Osteoporosis Foundation recommend at least 1,000 IU per day for women age 50 and over who are at risk of vitamin D  deficiency. Vitamin D deficiency can be caused by inadequate sun exposure (for example, those who live in northern latitudes).  Avoid alcohol and smoking. Heavy alcohol intake (more than 7 drinks per week) increases the risk of falls and hip fracture and women smokers tend to lose bone more rapidly and have lower bone mass than nonsmokers. Stopping smoking is one of the most important changes women can make to improve their health and decrease risk for disease.  Be physically active every day. Weight-bearing exercise (for example, fast walking, hiking, jogging, and weight training) may strengthen bones or slow the rate of bone loss that comes with aging. Balancing and muscle-strengthening exercises can reduce the risk of falling and fracture.  Consider therapeutic medications. Currently, several types of effective drugs are available. Healthcare providers can recommend the type most appropriate for each woman.  Eliminate environmental factors that may contribute to accidents. Falls cause nearly 90% of all osteoporotic fractures, so reducing this risk is an important bone-health strategy. Measures include ample lighting, removing obstructions to walking, using nonskid rugs on floors, and placing mats and/or grab bars in showers.  Be aware of medication side effects. Some common medicines make bones weaker. These include a type of steroid drug called glucocorticoids used for arthritis and asthma, some antiseizure drugs, certain sleeping pills, treatments for endometriosis, and some cancer drugs. An overactive thyroid gland or using too much thyroid hormone for an underactive thyroid can also be a problem. If you are taking these medicines, talk to your doctor about what you can do to help protect your bones.reduce the rate of osteoporosis.    Menopause can be associated with physical symptoms and risks. Hormone replacement therapy is available to decrease symptoms and risks. You should talk to your  health care provider   about whether hormone replacement therapy is right for you.   Use sunscreen. Apply sunscreen liberally and repeatedly throughout the day. You should seek shade when your shadow is shorter than you. Protect yourself by wearing long sleeves, pants, a wide-brimmed hat, and sunglasses year round, whenever you are outdoors.   Once a month, do a whole body skin exam, using a mirror to look at the skin on your back. Tell your health care provider of new moles, moles that have irregular borders, moles that are larger than a pencil eraser, or moles that have changed in shape or color.   -Stay current with required vaccines (immunizations).   Influenza vaccine. All adults should be immunized every year.  Tetanus, diphtheria, and acellular pertussis (Td, Tdap) vaccine. Pregnant women should receive 1 dose of Tdap vaccine during each pregnancy. The dose should be obtained regardless of the length of time since the last dose. Immunization is preferred during the 27th 36th week of gestation. An adult who has not previously received Tdap or who does not know her vaccine status should receive 1 dose of Tdap. This initial dose should be followed by tetanus and diphtheria toxoids (Td) booster doses every 10 years. Adults with an unknown or incomplete history of completing a 3-dose immunization series with Td-containing vaccines should begin or complete a primary immunization series including a Tdap dose. Adults should receive a Td booster every 10 years.  Varicella vaccine. An adult without evidence of immunity to varicella should receive 2 doses or a second dose if she has previously received 1 dose. Pregnant females who do not have evidence of immunity should receive the first dose after pregnancy. This first dose should be obtained before leaving the health care facility. The second dose should be obtained 4 8 weeks after the first dose.  Human papillomavirus (HPV) vaccine. Females aged 13 26  years who have not received the vaccine previously should obtain the 3-dose series. The vaccine is not recommended for use in pregnant females. However, pregnancy testing is not needed before receiving a dose. If a female is found to be pregnant after receiving a dose, no treatment is needed. In that case, the remaining doses should be delayed until after the pregnancy. Immunization is recommended for any person with an immunocompromised condition through the age of 26 years if she did not get any or all doses earlier. During the 3-dose series, the second dose should be obtained 4 8 weeks after the first dose. The third dose should be obtained 24 weeks after the first dose and 16 weeks after the second dose.  Zoster vaccine. One dose is recommended for adults aged 60 years or older unless certain conditions are present.  Measles, mumps, and rubella (MMR) vaccine. Adults born before 1957 generally are considered immune to measles and mumps. Adults born in 1957 or later should have 1 or more doses of MMR vaccine unless there is a contraindication to the vaccine or there is laboratory evidence of immunity to each of the three diseases. A routine second dose of MMR vaccine should be obtained at least 28 days after the first dose for students attending postsecondary schools, health care workers, or international travelers. People who received inactivated measles vaccine or an unknown type of measles vaccine during 1963 1967 should receive 2 doses of MMR vaccine. People who received inactivated mumps vaccine or an unknown type of mumps vaccine before 1979 and are at high risk for mumps infection should consider immunization with 2 doses of   MMR vaccine. For females of childbearing age, rubella immunity should be determined. If there is no evidence of immunity, females who are not pregnant should be vaccinated. If there is no evidence of immunity, females who are pregnant should delay immunization until after pregnancy.  Unvaccinated health care workers born before [birth year] who lack laboratory evidence of measles, mumps, or rubella immunity or laboratory confirmation of disease should consider measles and mumps immunization with 2 doses of MMR vaccine or rubella immunization with 1 dose of MMR vaccine.  Pneumococcal 13-valent conjugate (PCV13) vaccine. When indicated, a person who is uncertain of her immunization history and has no record of immunization should receive the PCV13 vaccine. An adult aged 54 years or older who has certain medical conditions and has not been previously immunized should receive 1 dose of PCV13 vaccine. This PCV13 should be followed with a dose of pneumococcal polysaccharide (PPSV23) vaccine. The PPSV23 vaccine dose should be obtained at least 8 weeks after the dose of PCV13 vaccine. An adult aged 58 years or older who has certain medical conditions and previously received 1 or more doses of PPSV23 vaccine should receive 1 dose of PCV13. The PCV13 vaccine dose should be obtained 1 or more years after the last PPSV23 vaccine dose.  Pneumococcal polysaccharide (PPSV23) vaccine. When PCV13 is also indicated, PCV13 should be obtained first. All adults aged 58 years and older should be immunized. An adult younger than age 65 years who has certain medical conditions should be immunized. Any person who resides in a nursing home or long-term care facility should be immunized. An adult smoker should be immunized. People with an immunocompromised condition and certain other conditions should receive both PCV13 and PPSV23 vaccines. People with human immunodeficiency virus (HIV) infection should be immunized as soon as possible after diagnosis. Immunization during chemotherapy or radiation therapy should be avoided. Routine use of PPSV23 vaccine is not recommended for American Indians, [place] Natives, or people younger than 65 years unless there are medical conditions that require PPSV23 vaccine. When indicated,  people who have unknown immunization and have no record of immunization should receive PPSV23 vaccine. One-time revaccination 5 years after the first dose of PPSV23 is recommended for people aged 70 64 years who have chronic kidney failure, nephrotic syndrome, asplenia, or immunocompromised conditions. People who received 1 2 doses of PPSV23 before age 32 years should receive another dose of PPSV23 vaccine at age 96 years or later if at least 5 years have passed since the previous dose. Doses of PPSV23 are not needed for people immunized with PPSV23 at or after age 55 years.  Meningococcal vaccine. Adults with asplenia or persistent complement component deficiencies should receive 2 doses of quadrivalent meningococcal conjugate (MenACWY-D) vaccine. The doses should be obtained at least 2 months apart. Microbiologists working with certain meningococcal bacteria, [employer] recruits, people at risk during an outbreak, and people who travel to or live in countries with a high rate of meningitis should be immunized. A first-year college student up through age 58 years who is living in a residence hall should receive a dose if she did not receive a dose on or after her 16th birthday. Adults who have certain high-risk conditions should receive one or more doses of vaccine.  Hepatitis A vaccine. Adults who wish to be protected from this disease, have certain high-risk conditions, work with hepatitis A-infected animals, work in hepatitis A research labs, or travel to or work in countries with a high rate of hepatitis A should be  immunized. Adults who were previously unvaccinated and who anticipate close contact with an international adoptee during the first 60 days after arrival in the [place] from a country with a high rate of hepatitis A should be immunized.  Hepatitis B vaccine.  Adults who wish to be protected from this disease, have certain high-risk conditions, may be exposed to blood or other infectious  body fluids, are household contacts or sex partners of hepatitis B positive people, are clients or workers in certain care facilities, or travel to or work in countries with a high rate of hepatitis B should be immunized.  Haemophilus influenzae type b (Hib) vaccine. A previously unvaccinated person with asplenia or sickle cell disease or having a scheduled splenectomy should receive 1 dose of Hib vaccine. Regardless of previous immunization, a recipient of a hematopoietic stem cell transplant should receive a 3-dose series 6 12 months after her successful transplant. Hib vaccine is not recommended for adults with HIV infection.  Preventive Services / Frequency Ages 6 to 39years  Blood pressure check.** / Every 1 to 2 years.  Lipid and cholesterol check.** / Every 5 years beginning at age 39.  Clinical breast exam.** / Every 3 years for women in their 61s and 62s.  BRCA-related cancer risk assessment.** / For women who have family members with a BRCA-related cancer (breast, ovarian, tubal, or peritoneal cancers).  Pap test.** / Every 2 years from ages 47 through 85. Every 3 years starting at age 34 through age 12 or 74 with a history of 3 consecutive normal Pap tests.  HPV screening.** / Every 3 years from ages 46 through ages 43 to 54 with a history of 3 consecutive normal Pap tests.  Hepatitis C blood test.** / For any individual with known risks for hepatitis C.  Skin self-exam. / Monthly.  Influenza vaccine. / Every year.  Tetanus, diphtheria, and acellular pertussis (Tdap, Td) vaccine.** / Consult your health care provider. Pregnant women should receive 1 dose of Tdap vaccine during each pregnancy. 1 dose of Td every 10 years.  Varicella vaccine.** / Consult your health care provider. Pregnant females who do not have evidence of immunity should receive the first dose after pregnancy.  HPV vaccine. / 3 doses over 6 months, if 64 and younger. The vaccine is not recommended for use in  pregnant females. However, pregnancy testing is not needed before receiving a dose.  Measles, mumps, rubella (MMR) vaccine.** / You need at least 1 dose of MMR if you were born in 1957 or later. You may also need a 2nd dose. For females of childbearing age, rubella immunity should be determined. If there is no evidence of immunity, females who are not pregnant should be vaccinated. If there is no evidence of immunity, females who are pregnant should delay immunization until after pregnancy.  Pneumococcal 13-valent conjugate (PCV13) vaccine.** / Consult your health care provider.  Pneumococcal polysaccharide (PPSV23) vaccine.** / 1 to 2 doses if you smoke cigarettes or if you have certain conditions.  Meningococcal vaccine.** / 1 dose if you are age 71 to 38 years and a Market researcher living in a residence hall, or have one of several medical conditions, you need to get vaccinated against meningococcal disease. You may also need additional booster doses.  Hepatitis A vaccine.** / Consult your health care provider.  Hepatitis B vaccine.** / Consult your health care provider.  Haemophilus influenzae type b (Hib) vaccine.** / Consult your health care provider.  Ages 55 to 64years  Blood pressure check.** / Every 1 to 2 years.  Lipid and cholesterol check.** / Every 5 years beginning at age 20 years.  Lung cancer screening. / Every year if you are aged 55 80 years and have a 30-pack-year history of smoking and currently smoke or have quit within the past 15 years. Yearly screening is stopped once you have quit smoking for at least 15 years or develop a health problem that would prevent you from having lung cancer treatment.  Clinical breast exam.** / Every year after age 40 years.  BRCA-related cancer risk assessment.** / For women who have family members with a BRCA-related cancer (breast, ovarian, tubal, or peritoneal cancers).  Mammogram.** / Every year beginning at age 40  years and continuing for as long as you are in good health. Consult with your health care provider.  Pap test.** / Every 3 years starting at age 30 years through age 65 or 70 years with a history of 3 consecutive normal Pap tests.  HPV screening.** / Every 3 years from ages 30 years through ages 65 to 70 years with a history of 3 consecutive normal Pap tests.  Fecal occult blood test (FOBT) of stool. / Every year beginning at age 50 years and continuing until age 75 years. You may not need to do this test if you get a colonoscopy every 10 years.  Flexible sigmoidoscopy or colonoscopy.** / Every 5 years for a flexible sigmoidoscopy or every 10 years for a colonoscopy beginning at age 50 years and continuing until age 75 years.  Hepatitis C blood test.** / For all people born from 1945 through 1965 and any individual with known risks for hepatitis C.  Skin self-exam. / Monthly.  Influenza vaccine. / Every year.  Tetanus, diphtheria, and acellular pertussis (Tdap/Td) vaccine.** / Consult your health care provider. Pregnant women should receive 1 dose of Tdap vaccine during each pregnancy. 1 dose of Td every 10 years.  Varicella vaccine.** / Consult your health care provider. Pregnant females who do not have evidence of immunity should receive the first dose after pregnancy.  Zoster vaccine.** / 1 dose for adults aged 60 years or older.  Measles, mumps, rubella (MMR) vaccine.** / You need at least 1 dose of MMR if you were born in 1957 or later. You may also need a 2nd dose. For females of childbearing age, rubella immunity should be determined. If there is no evidence of immunity, females who are not pregnant should be vaccinated. If there is no evidence of immunity, females who are pregnant should delay immunization until after pregnancy.  Pneumococcal 13-valent conjugate (PCV13) vaccine.** / Consult your health care provider.  Pneumococcal polysaccharide (PPSV23) vaccine.** / 1 to 2 doses if  you smoke cigarettes or if you have certain conditions.  Meningococcal vaccine.** / Consult your health care provider.  Hepatitis A vaccine.** / Consult your health care provider.  Hepatitis B vaccine.** / Consult your health care provider.  Haemophilus influenzae type b (Hib) vaccine.** / Consult your health care provider.  Ages 65 years and over  Blood pressure check.** / Every 1 to 2 years.  Lipid and cholesterol check.** / Every 5 years beginning at age 20 years.  Lung cancer screening. / Every year if you are aged 55 80 years and have a 30-pack-year history of smoking and currently smoke or have quit within the past 15 years. Yearly screening is stopped once you have quit smoking for at least 15 years or develop a health problem that   would prevent you from having lung cancer treatment.  Clinical breast exam.** / Every year after age 103 years.  BRCA-related cancer risk assessment.** / For women who have family members with a BRCA-related cancer (breast, ovarian, tubal, or peritoneal cancers).  Mammogram.** / Every year beginning at age 36 years and continuing for as long as you are in good health. Consult with your health care provider.  Pap test.** / Every 3 years starting at age 5 years through age 85 or 10 years with 3 consecutive normal Pap tests. Testing can be stopped between 65 and 70 years with 3 consecutive normal Pap tests and no abnormal Pap or HPV tests in the past 10 years.  HPV screening.** / Every 3 years from ages 93 years through ages 70 or 45 years with a history of 3 consecutive normal Pap tests. Testing can be stopped between 65 and 70 years with 3 consecutive normal Pap tests and no abnormal Pap or HPV tests in the past 10 years.  Fecal occult blood test (FOBT) of stool. / Every year beginning at age 8 years and continuing until age 45 years. You may not need to do this test if you get a colonoscopy every 10 years.  Flexible sigmoidoscopy or colonoscopy.** /  Every 5 years for a flexible sigmoidoscopy or every 10 years for a colonoscopy beginning at age 69 years and continuing until age 68 years.  Hepatitis C blood test.** / For all people born from [birth year] through 1964 and any individual with known risks for hepatitis C.  Osteoporosis screening.** / A one-time screening for women ages 7 years and over and women at risk for fractures or osteoporosis.  Skin self-exam. / Monthly.  Influenza vaccine. / Every year.  Tetanus, diphtheria, and acellular pertussis (Tdap/Td) vaccine.** / 1 dose of Td every 10 years.  Varicella vaccine.** / Consult your health care provider.  Zoster vaccine.** / 1 dose for adults aged 5 years or older.  Pneumococcal 13-valent conjugate (PCV13) vaccine.** / Consult your health care provider.  Pneumococcal polysaccharide (PPSV23) vaccine.** / 1 dose for all adults aged 74 years and older.  Meningococcal vaccine.** / Consult your health care provider.  Hepatitis A vaccine.** / Consult your health care provider.  Hepatitis B vaccine.** / Consult your health care provider.  Haemophilus influenzae type b (Hib) vaccine.** / Consult your health care provider. ** Family history and personal history of risk and conditions may change your health care provider's recommendations. Document Released: 07/26/2001 Document Revised: 03/20/2013  [facility] Patient Information 2014 [place], [place].   EXERCISE AND DIET:  We recommended that you start or continue a regular exercise program for good health. Regular exercise means any activity that makes your heart beat faster and makes you sweat.  We recommend exercising at least 30 minutes per day at least 3 days a week, preferably 5.  We also recommend a diet low in fat and sugar / carbohydrates.  Inactivity, poor dietary choices and obesity can cause diabetes, heart attack, stroke, and kidney damage, among others.     ALCOHOL AND SMOKING:  Women should limit their alcohol intake to no  more than 7 drinks/beers/glasses of wine (combined, not each!) per week. Moderation of alcohol intake to this level decreases your risk of breast cancer and liver damage.  ( And of course, no recreational drugs are part of a healthy lifestyle.)  Also, you should not be smoking at all or even being exposed to second hand smoke. Most people know smoking can  cause cancer, and various heart and lung diseases, but did you know it also contributes to weakening of your bones?  Aging of your skin?  Yellowing of your teeth and nails?   CALCIUM AND VITAMIN D:  Adequate intake of calcium and Vitamin D are recommended.  The recommendations for exact amounts of these supplements seem to change often, but generally speaking 600 mg of calcium (either carbonate or citrate) and 800 units of Vitamin D per day seems prudent. Certain women may benefit from higher intake of Vitamin D.  If you are among these women, your doctor will have told you during your visit.     PAP SMEARS:  Pap smears, to check for cervical cancer or precancers,  have traditionally been done yearly, although recent scientific advances have shown that most women can have pap smears less often.  However, every woman still should have a physical exam from her gynecologist or primary care physician every year. It will include a breast check, inspection of the vulva and vagina to check for abnormal growths or skin changes, a visual exam of the cervix, and then an exam to evaluate the size and shape of the uterus and ovaries.  And after 32 years of age, a rectal exam is indicated to check for rectal cancers. We will also provide age appropriate advice regarding health maintenance, like when you should have certain vaccines, screening for sexually transmitted diseases, bone density testing, colonoscopy, mammograms, etc.    MAMMOGRAMS:  All women over 71 years old should have a yearly mammogram. Many facilities now offer a "3D" mammogram, which may cost  around $50 extra out of pocket. If possible,  we recommend you accept the option to have the 3D mammogram performed.  It both reduces the number of women who will be called back for extra views which then turn out to be normal, and it is better than the routine mammogram at detecting truly abnormal areas.     COLONOSCOPY:  Colonoscopy to screen for colon cancer is recommended for all women at age 52.  We know, you hate the idea of the prep.  We agree, BUT, having colon cancer and not knowing it is worse!!  Colon cancer so often starts as a polyp that can be seen and removed at colonscopy, which can quite literally save your life!  And if your first colonoscopy is normal and you have no family history of colon cancer, most women don't have to have it again for 10 years.  Once every ten years, you can do something that may end up saving your life, right?  We will be happy to help you get it scheduled when you are ready.  Be sure to check your insurance coverage so you understand how much it will cost.  It may be covered as a preventative service at no cost, but you should check your particular policy.

## 2018-11-30 ENCOUNTER — Encounter: Payer: Self-pay | Admitting: Family Medicine

## 2018-12-01 LAB — HEMOGLOBIN A1C
Est. average glucose Bld gHb Est-mCnc: 103 mg/dL
Hgb A1c MFr Bld: 5.2 % (ref 4.8–5.6)

## 2018-12-01 LAB — COMPREHENSIVE METABOLIC PANEL
ALT: 9 IU/L (ref 0–32)
AST: 17 IU/L (ref 0–40)
Albumin/Globulin Ratio: 1.6 (ref 1.2–2.2)
Albumin: 4.1 g/dL (ref 3.8–4.8)
Alkaline Phosphatase: 54 IU/L (ref 39–117)
BUN/Creatinine Ratio: 8 — ABNORMAL LOW (ref 9–23)
BUN: 9 mg/dL (ref 6–20)
Bilirubin Total: 0.4 mg/dL (ref 0.0–1.2)
CO2: 20 mmol/L (ref 20–29)
Calcium: 9.1 mg/dL (ref 8.7–10.2)
Chloride: 102 mmol/L (ref 96–106)
Creatinine, Ser: 1.09 mg/dL — ABNORMAL HIGH (ref 0.57–1.00)
GFR calc Af Amer: 78 mL/min/{1.73_m2} (ref >59)
GFR calc non Af Amer: 68 mL/min/{1.73_m2} (ref >59)
Globulin, Total: 2.6 g/dL (ref 1.5–4.5)
Glucose: 108 mg/dL — ABNORMAL HIGH (ref 65–99)
Potassium: 4.5 mmol/L (ref 3.5–5.2)
Sodium: 136 mmol/L (ref 134–144)
Total Protein: 6.7 g/dL (ref 6.0–8.5)

## 2018-12-01 LAB — CBC WITH DIFFERENTIAL
Basophils Absolute: 0 10*3/uL (ref 0.0–0.2)
Basos: 1 %
EOS (ABSOLUTE): 0 10*3/uL (ref 0.0–0.4)
Eos: 1 %
Hematocrit: 42.5 % (ref 34.0–46.6)
Hemoglobin: 14.7 g/dL (ref 11.1–15.9)
Immature Grans (Abs): 0 10*3/uL (ref 0.0–0.1)
Immature Granulocytes: 0 %
Lymphocytes Absolute: 2.8 10*3/uL (ref 0.7–3.1)
Lymphs: 42 %
MCH: 32.5 pg (ref 26.6–33.0)
MCHC: 34.6 g/dL (ref 31.5–35.7)
MCV: 94 fL (ref 79–97)
Monocytes Absolute: 0.4 10*3/uL (ref 0.1–0.9)
Monocytes: 6 %
Neutrophils Absolute: 3.3 10*3/uL (ref 1.4–7.0)
Neutrophils: 50 %
RBC: 4.53 x10E6/uL (ref 3.77–5.28)
RDW: 12.2 % (ref 11.7–15.4)
WBC: 6.6 10*3/uL (ref 3.4–10.8)

## 2018-12-01 LAB — LIPID PANEL
Chol/HDL Ratio: 3.3 ratio (ref 0.0–4.4)
Cholesterol, Total: 169 mg/dL (ref 100–199)
HDL: 51 mg/dL (ref >39)
LDL Calculated: 91 mg/dL (ref 0–99)
Triglycerides: 133 mg/dL (ref 0–149)
VLDL Cholesterol Cal: 27 mg/dL (ref 5–40)

## 2018-12-01 LAB — T4, FREE: Free T4: 1.49 ng/dL (ref 0.82–1.77)

## 2018-12-01 LAB — VITAMIN D 1,25 DIHYDROXY
Vitamin D 1, 25 (OH)2 Total: 33 pg/mL
Vitamin D2 1, 25 (OH)2: <10 pg/mL
Vitamin D3 1, 25 (OH)2: 31 pg/mL

## 2018-12-01 LAB — TSH: TSH: 1.18 u[IU]/mL (ref 0.450–4.500)

## 2019-01-10 DIAGNOSIS — N925 Other specified irregular menstruation: Secondary | ICD-10-CM | POA: Diagnosis not present

## 2019-01-21 DIAGNOSIS — O021 Missed abortion: Secondary | ICD-10-CM | POA: Diagnosis not present

## 2019-02-07 DIAGNOSIS — O0489 (Induced) termination of pregnancy with other complications: Secondary | ICD-10-CM | POA: Diagnosis not present

## 2019-04-03 ENCOUNTER — Telehealth: Payer: Self-pay | Admitting: Family Medicine

## 2019-04-03 NOTE — Telephone Encounter (Signed)
Patient called states she was exposed to a Positive case in work environment & wishes advice. --Forwarding message to medical assistant to contact her back @ [phone]  --glh

## 2019-04-03 NOTE — Telephone Encounter (Signed)
Patient should not be tested unless there was known exposure to a positive-laboratory confirmed case- of more than 15 minutes in an enclosed space remaining within 6 feet of this individual during that time period.   --> Or if pt develops covid-like sx, then go for testing.

## 2019-04-03 NOTE — Telephone Encounter (Signed)
Not a confirmed positive. Husband gave a friend a 40 minute car ride who is now sick, and no positive test at this time. No symptoms. Would like to know if she should get tested.

## 2019-04-04 NOTE — Telephone Encounter (Signed)
Pt advised via my chart.

## 2019-04-23 ENCOUNTER — Other Ambulatory Visit: Payer: Self-pay | Admitting: Family Medicine

## 2019-05-28 ENCOUNTER — Other Ambulatory Visit: Payer: Self-pay

## 2019-05-28 ENCOUNTER — Ambulatory Visit (INDEPENDENT_AMBULATORY_CARE_PROVIDER_SITE_OTHER): Payer: 59 | Admitting: Family Medicine

## 2019-05-28 ENCOUNTER — Encounter: Payer: Self-pay | Admitting: Family Medicine

## 2019-05-28 VITALS — BP 104/54 | Temp 98.8°F

## 2019-05-28 DIAGNOSIS — D509 Iron deficiency anemia, unspecified: Secondary | ICD-10-CM | POA: Diagnosis not present

## 2019-05-28 DIAGNOSIS — Z9889 Other specified postprocedural states: Secondary | ICD-10-CM

## 2019-05-28 DIAGNOSIS — Z8639 Personal history of other endocrine, nutritional and metabolic disease: Secondary | ICD-10-CM

## 2019-05-28 DIAGNOSIS — E559 Vitamin D deficiency, unspecified: Secondary | ICD-10-CM

## 2019-05-28 DIAGNOSIS — F4321 Adjustment disorder with depressed mood: Secondary | ICD-10-CM | POA: Diagnosis not present

## 2019-05-28 DIAGNOSIS — G479 Sleep disorder, unspecified: Secondary | ICD-10-CM | POA: Diagnosis not present

## 2019-05-28 DIAGNOSIS — J3089 Other allergic rhinitis: Secondary | ICD-10-CM

## 2019-05-28 DIAGNOSIS — E538 Deficiency of other specified B group vitamins: Secondary | ICD-10-CM | POA: Diagnosis not present

## 2019-05-28 MED ORDER — VITAMIN D (ERGOCALCIFEROL) 1.25 MG (50000 UNIT) PO CAPS: ORAL_CAPSULE | ORAL | 3 refills | Status: DC

## 2019-05-28 NOTE — Progress Notes (Signed)
Virtual / live video office visit note for [employer], D.O- Primary Care Physician at [facility]   I connected with current patient today and beyond visually recognizing the correct individual, I verified that I am speaking with the correct person using two identifiers.  . Location of the patient: Home . Location of the provider: Office Only the patient (+/- their family members at pt's discretion) and myself were participating in the encounter    - This visit type was conducted due to national recommendations for restrictions regarding the COVID-19 Pandemic (e.g. social distancing) in an effort to limit this patient's exposure and mitigate transmission in our community.  This format is felt to be most appropriate for this patient at this time.   - The patient did have access to video technology today  - No physical exam could be performed with this format, beyond that communicated to [place] by the patient/ family members as noted.   - Additionally my office staff/ schedulers discussed with the patient that there may be a monetary charge related to this service, depending on patient's medical insurance.   The patient expressed understanding, and agreed to proceed.      History of Present Illness:  I, Toni Amend, am serving as scribe for Dr. Mellody Dance.   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   - Recent Pregnancy & Termination Notes around last OV, she was talking to her husband about trying to get pregnant; "it was on the table."  A few weeks after this conversation, her husband "gave her an impassioned plea to start this adventure together, to do this for posterity," etc.  They began trying, and  patient notes she became pregnant right away.  She was really excited, and called her husband from work to tell him the news when she found out.  States her husband then "flipped out" and was not happy about the pregnancy at all.  Indicates "a bunch of revelations occurred around this."  He told the patient "I'm not ready for [the pregnancy], I don't want it, I didn't think you would actually get pregnant," etc.  Patient thought the possibility of them staying together as a couple looked slim at that point, and she was unprepared to be a single mom.  Notes she did not have the appropriate financial or emotional resources, and ultimately did not want to bring a child into the world in such a situation.  After much deliberation she decided to terminate the pregnancy.    She obtained OBGYN care through Dr. Bobbye Charleston.  Says she finally feels physically recovered from her pregnancy, etc.  Notes "I was so sick with morning sickness, and it took a while to get through things and get over things."   She has had a really hard time with everything that's happened.  She and her husband remain together "for the moment".     -States "he's really committed to staying together, but it's been real ugly."    Adds "it's been weird and certainly not something I would wish on anyone else; not something that I expected to ever have to go through myself."     -  Mood Management She continues on Effexor.  Notes Sharyn Lull increased this dosage, and patient is happy with current management.  Says she felt like she was "unusually unhappy" with everything going on, and this medication change worked for her.  She continues to see her counselor/therapist sporadically.  Notes she and her husband went to couples therapy for five or six sessions, and she has been seeing an individual counselor occasionally in addition.   - [place] her sleep has been better lately.  Notes she has a tendency to wake up in  the middle of the night sometimes and have difficulty going back to sleep.  She tries not to use Unisom more than once every three days because she knows she begins to become reliant on it.  Has been trying to go to bed really early, "which is socially challenging," but notes this really helps and she feels a difference with it.   - hx of HypoThyroid Notes she has not taken synthroid in over a year.  States she "didn't do well on it, to be honest," and remembers her last thyroid panel looking normal.   - Vitamin D She is not currently taking Vitamin D.  Notes she takes a Calcium/Vitamin D supplement daily and is getting 800 IU's of Vitamin D through this.   - Allergies Has had a sore throat for the last week, but notes this "comes and goes" and is pretty manageable usually.  Says she tries to hydrate and uses sugar-free lozenges for relief.  Notes "Hasn't noticeably been an issue" and "has been outside a lot."  Says since her husband "decided he was going to go through his midlife crisis early," she "went full suburban dad" and bought a motorcycle.  She rides her motorcycle often and notes she loves it.   - Exercise Habits She continues exercising at least six hours per week.  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  Since the gyms re-opened[facility].  Says she [facility] safe [facility] the [facility] and thinks they are doing a diligent job.  [facility].  Says she hates doing cardio inside with a mask on, "it's so miserable."     Depression screen [facility] 2/9 05/28/2019 11/26/2018 10/15/2018 12/19/2017 11/20/2017  Decreased Interest 0 1 0 1 1  Down, Depressed, Hopeless 0 1 0 0 1  PHQ - 2 Score 0 2 0 1 2  Altered sleeping 1 3 2 2 2   Tired, decreased energy 0 1 0 2 1  Change in appetite 0 0 3 1 1   Feeling bad or failure about  yourself  1 1 0 1 2  Trouble concentrating 0 0 0 1 2  Moving slowly or fidgety/restless 0 0 0 0 0  Suicidal thoughts 0 0 0 0 0  PHQ-9 Score 2 7 5 8 10   Difficult doing work/chores Not difficult at all Somewhat difficult Not difficult at all Somewhat difficult Somewhat difficult    GAD 7 : Generalized Anxiety Score 10/15/2018  Nervous, Anxious, on Edge 0  Control/stop worrying 0  Worry too much - different things 0  Trouble relaxing 0  Restless 0  Easily annoyed or irritable 0  Afraid - awful might happen 0  Total GAD 7 Score 0  Anxiety Difficulty Not difficult at all     Impression and Recommendations:    1. Adjustment disorder with depressed mood   2. Vitamin D insufficiency   3. History of hypothyroidism   4. Sleep disturbance   5. Environmental and seasonal allergies   6. Iron deficiency anemia, unspecified iron deficiency anemia type   7. B12 deficiency   8. History of elective abortion- spring 2020     History of Hypothyroidism - Asymptomatic at this time.    - Patient knows to monitor for symptoms of hypothyroidism, and call if she begins to experience them.  Patient will let the clinic know if she desires lab work to assess her thyroid panel.  - Otherwise, will re-check as discussed, one year since last labs.  - Will continue to monitor.  Vitamin D Insufficiency - Recommended appropriate Vitamin D supplementation. - Discussed goal range of 40-60. - Patient agrees to begin once-weekly supplementation.  Adjustment Disorder with Depressed Mood - Stable on current  Management. - Continue Effexor as established.  See med list.  - Encouraged patient to continue seeking regular therapy/counseling.  - In addition to therapy/counseling and prescription intervention, reviewed the "spokes of the wheel" of mood and health management.  Stressed the importance of ongoing prudent habits, including regular exercise, appropriate sleep hygiene, healthful dietary habits, and  prayer/meditation to relax.  - Will continue to monitor.  Sleep Disturbance - Stable at this time, per pt. - Patient knows to let us know if she desires additional assistance with sleep. - Will continue to monitor.  Environmental & Seasonal Allergies - Stable at this time. - Continue treatment plan as prescribed. - Will continue to monitor.  Iron Deficiency Anemia - Stable at this time. - Continue management as prescribed. - Will continue to monitor and re-check as discussed.  B12 Deficiency - Stable at this time. - Continue supplementation as prescribed. - Will continue to monitor and re-check as discussed.  History of Elective Abortion, Spring 2020 - Patient will continue to follow-up with OBGYN as established.  Recommendations - Patient will let us know if anything changes and call in with concerns PRN.  - Last seen for CPE November 26, 2018. - Return in 6 months for CPE, fasting lab work, iron, and B12.  - As part of my medical decision making, I reviewed the following data within the [place] History obtained from pt, CMA notes reviewed and incorporated if applicable, Labs reviewed, Radiograph/ tests reviewed if applicable and OV notes from prior OV's with me, as well as other specialists she/he has seen since seeing me last, were all reviewed and used in my medical decision making process today.   - Additionally, discussion had with patient regarding txmnt plan, their biases about that plan etc were used in my medical decision making today.   - The patient agreed with the plan and demonstrated an understanding of the instructions.   No barriers to understanding were identified.     Return for CPE in 6 months with full fasting lab work, iron, B12.    Meds ordered this encounter  Medications  . Vitamin D, Ergocalciferol, (DRISDOL) 1.25 MG (50000 UT) CAPS capsule    Sig: Take one tablet wkly    Dispense:  12 capsule    Refill:  3    Medications  Discontinued During This Encounter  Medication Reason  . cetirizine (ZYRTEC) 10 MG tablet Error  . folic acid (FOLVITE) [id number] MCG tablet Error  Note:  This note was prepared with assistance of Dragon voice recognition software. Occasional wrong-word or sound-a-like substitutions may have occurred due to the inherent limitations of voice recognition software.  This document serves as a record of services personally performed by Mellody Dance, DO. It was created on her behalf by Toni Amend, a trained medical scribe. The creation of this record is based on the scribe's personal observations and the provider's statements to them.   This case required medical decision making of at least moderate complexity. The above documentation has been reviewed to be accurate and was completed by Marjory Sneddon, D.O.       Patient Care Team    Relationship Specialty Notifications Start End  Mellody Dance, DO PCP - General Family Medicine  12/13/17   Gerda Diss, DO Consulting Physician Sports Medicine  11/20/17   Lyndal Pulley, DO Consulting Physician Family Medicine  10/15/18    Comment: Treatment of her concussion-gives her Effexor  Shuping, Rockney Ghee, MD Referring Physician Physical Medicine and Rehabilitation  10/15/18    Comment: POST-CONCUSSION  Bobbye Charleston, MD Consulting Physician Obstetrics and Gynecology  11/26/18     -Vitals obtained; medications/ allergies reconciled;  personal medical, social, Sx etc.histories were updated by CMA, reviewed by me and are reflected in chart  Patient Active Problem List   Diagnosis Date Noted  . BMI 30-34.9 11/20/2017  . History of hypothyroidism 05/28/2019  . Environmental and seasonal allergies 10/15/2018  . Vitamin D insufficiency 12/19/2017  . Consumes a vegan diet 12/19/2017  . Iron deficiency anemia 11/20/2017  . B12 deficiency 11/20/2017  . History of elective abortion 05/28/2019  . h/o Exercise-induced  bronchoconstriction 04/10/2017  . Sleep disturbance 01/02/2017  . Adjustment disorder with depressed mood 05/28/2019  . Mild concussion 04/11/2018  . Subclinical hypothyroidism 12/19/2017  . Elevated TSH 12/19/2017  . Mononeuropathy 11/20/2017  . Fatigue 04/28/2017  . Metatarsalgia of both feet 11/15/2016  . Right leg pain 10/31/2016  . Chronic right-sided low back pain with sciatica 09/29/2016  . Intervertebral disk disease 07/11/2016     Current Meds  Medication Sig  . B COMPLEX-C PO Take 1,500 mg by mouth.  . calcium carbonate (OSCAL) 1500 (600 Ca) MG TABS tablet Take 2 (two) times daily by mouth.  . doxylamine, Sleep, (UNISOM) 25 MG tablet Take 25 mg by mouth at bedtime as needed.  . etonogestrel-ethinyl estradiol (NUVARING) 0.12-0.015 MG/24HR vaginal ring Place 1 each vaginally every 28 (twenty-eight) days. Insert vaginally and leave in place for 3 consecutive weeks, then remove for 1 week.  . loratadine (CLARITIN) 10 MG tablet Take 10 mg by mouth daily.  . Omega-3 Fatty Acids (FISH OIL) 1000 MG CAPS Take 3,000 mg by mouth daily.   Marland Kitchen venlafaxine XR (EFFEXOR-XR) 37.5 MG 24 hr capsule TAKE 1 CAPSULE BY MOUTH DAILY WITH BREAKFAST (Patient taking differently: Take 75 mg by mouth. )     Allergies  Allergen Reactions  . Ceftin [Cefuroxime Axetil] Shortness Of Breath  . Ciprofloxacin Shortness Of Breath  . Aspirin Nausea Only  . Doxycycline Nausea Only  . Zithromax [Azithromycin] Swelling     ROS:  See above HPI for pertinent positives and negatives   Objective:   Blood pressure (!) 104/54, temperature 98.8 F (37.1 C), last menstrual period 05/07/2019.  (if some vitals are omitted, this means that patient was UNABLE to obtain them even though they were asked to get them prior to [place] today.  They were asked to call  us at their earliest convenience with these once obtained.)  General: A & O * 3; visually in no acute distress; in usual state of health.  Skin: Visible skin  appears normal and pt's usual skin color HEENT:  EOMI, head is normocephalic and atraumatic.  Sclera are anicteric. Neck has a good range of motion.  Lips are noncyanotic Chest: normal chest excursion and movement Respiratory: speaking in full sentences, no conversational dyspnea; no use of accessory muscles Psych: insight good, mood- appears full

## 2019-06-12 ENCOUNTER — Encounter: Payer: Self-pay | Admitting: Family Medicine

## 2019-06-13 ENCOUNTER — Other Ambulatory Visit: Payer: Self-pay

## 2019-06-13 ENCOUNTER — Ambulatory Visit: Payer: 59 | Attending: Internal Medicine

## 2019-06-13 DIAGNOSIS — Z20828 Contact with and (suspected) exposure to other viral communicable diseases: Secondary | ICD-10-CM | POA: Diagnosis not present

## 2019-06-13 DIAGNOSIS — Z20822 Contact with and (suspected) exposure to covid-19: Secondary | ICD-10-CM

## 2019-06-15 LAB — NOVEL CORONAVIRUS, NAA: SARS-CoV-2, NAA: DETECTED — AB

## 2019-06-20 ENCOUNTER — Encounter: Payer: Self-pay | Admitting: Family Medicine

## 2019-07-03 DIAGNOSIS — F4320 Adjustment disorder, unspecified: Secondary | ICD-10-CM | POA: Diagnosis not present

## 2019-07-08 ENCOUNTER — Encounter: Payer: Self-pay | Admitting: Family Medicine

## 2019-07-09 DIAGNOSIS — Z03818 Encounter for observation for suspected exposure to other biological agents ruled out: Secondary | ICD-10-CM | POA: Diagnosis not present

## 2019-07-10 ENCOUNTER — Ambulatory Visit: Payer: 59 | Attending: Internal Medicine

## 2019-07-10 DIAGNOSIS — Z20822 Contact with and (suspected) exposure to covid-19: Secondary | ICD-10-CM | POA: Diagnosis not present

## 2019-07-10 DIAGNOSIS — F4320 Adjustment disorder, unspecified: Secondary | ICD-10-CM | POA: Diagnosis not present

## 2019-07-11 LAB — NOVEL CORONAVIRUS, NAA: SARS-CoV-2, NAA: NOT DETECTED

## 2019-07-17 DIAGNOSIS — F4320 Adjustment disorder, unspecified: Secondary | ICD-10-CM | POA: Diagnosis not present

## 2019-07-18 DIAGNOSIS — F4320 Adjustment disorder, unspecified: Secondary | ICD-10-CM | POA: Diagnosis not present

## 2019-07-22 ENCOUNTER — Ambulatory Visit: Payer: 59 | Attending: Internal Medicine

## 2019-07-22 ENCOUNTER — Other Ambulatory Visit: Payer: 59

## 2019-07-22 DIAGNOSIS — Z20822 Contact with and (suspected) exposure to covid-19: Secondary | ICD-10-CM | POA: Diagnosis not present

## 2019-07-23 ENCOUNTER — Ambulatory Visit: Payer: 59 | Attending: Internal Medicine

## 2019-07-23 DIAGNOSIS — Z20822 Contact with and (suspected) exposure to covid-19: Secondary | ICD-10-CM | POA: Diagnosis not present

## 2019-07-23 LAB — NOVEL CORONAVIRUS, NAA: SARS-CoV-2, NAA: NOT DETECTED

## 2019-07-24 ENCOUNTER — Encounter: Payer: Self-pay | Admitting: Adult Health

## 2019-07-24 ENCOUNTER — Ambulatory Visit (INDEPENDENT_AMBULATORY_CARE_PROVIDER_SITE_OTHER): Payer: 59 | Admitting: Adult Health

## 2019-07-24 ENCOUNTER — Other Ambulatory Visit: Payer: 59

## 2019-07-24 DIAGNOSIS — J029 Acute pharyngitis, unspecified: Secondary | ICD-10-CM

## 2019-07-24 DIAGNOSIS — F4320 Adjustment disorder, unspecified: Secondary | ICD-10-CM | POA: Diagnosis not present

## 2019-07-24 LAB — POCT RAPID STREP A (OFFICE): Rapid Strep A Screen: NEGATIVE

## 2019-07-24 LAB — NOVEL CORONAVIRUS, NAA: SARS-CoV-2, NAA: NOT DETECTED

## 2019-07-24 NOTE — Progress Notes (Signed)
Virtual Visit via Telephone Note  I connected with Sherry Luna on 07/24/19 at  4:15 PM EST by telephone and verified that I am speaking with the correct person using two identifiers.  Location: Patient: Home Provider: In Clinic   I discussed the limitations, risks, security and privacy concerns of performing an evaluation and management service by telephone and the availability of in person appointments. I also discussed with the patient that there may be a patient responsible charge related to this service. The patient expressed understanding and agreed to proceed.   History of Present Illness: Sherry Luna calls in complaints of sore throat for 1.5 weeks. Pain is continuous, described as "burning", rated 8/10. She also reports intermittent non-productive nocturnal cough.  She has increased water intake and OTC Zinc lozanges. She denies recurrent strep infections in past. She denies HA/nasal drainage. She denies fever. She denies abdominal pain/N/V/D. She denies tobacco/vape/excessive ETOH use  She is leaving the [place] for 3 months tomorrow for [place] of [place]. [place] requires serial Corona testing- SARS-CoV-2 Neg 07/22/2019 SARS-CoV-2 tested 07/23/2019-results pending-RESULTS BEING VERIFIED  SARS-CoV-212/31/2020 - POSITIVE  Patient Care Team    Relationship Specialty Notifications Start End  Mellody Dance, DO PCP - General Family Medicine  12/13/17   Gerda Diss, DO Consulting Physician Sports Medicine  11/20/17   Lyndal Pulley, DO Consulting Physician Family Medicine  10/15/18    Comment: Treatment of her concussion-gives her Effexor  Shuping, Rockney Ghee, MD Referring Physician Physical Medicine and Rehabilitation  10/15/18    Comment: POST-CONCUSSION  Bobbye Charleston, MD Consulting Physician Obstetrics and Gynecology  11/26/18     Patient Active Problem List   Diagnosis Date Noted  . Sore throat 07/24/2019  . History of elective abortion  05/28/2019  . History of hypothyroidism 05/28/2019  . Adjustment disorder with depressed mood 05/28/2019  . Environmental and seasonal allergies 10/15/2018  . Mild concussion 04/11/2018  . Vitamin D insufficiency 12/19/2017  . Subclinical hypothyroidism 12/19/2017  . Elevated TSH 12/19/2017  . Consumes a vegan diet 12/19/2017  . Iron deficiency anemia 11/20/2017  . B12 deficiency 11/20/2017  . Mononeuropathy 11/20/2017  . BMI 30-34.9 11/20/2017  . Fatigue 04/28/2017  . h/o Exercise-induced bronchoconstriction 04/10/2017  . Sleep disturbance 01/02/2017  . Metatarsalgia of both feet 11/15/2016  . Right leg pain 10/31/2016  . Chronic right-sided low back pain with sciatica 09/29/2016  . Intervertebral disk disease 07/11/2016     Past Medical History:  Diagnosis Date  . COVID-19      Past Surgical History:  Procedure Laterality Date  . APPENDECTOMY    . CHOLECYSTECTOMY    . FRACTURE SURGERY     [place]  . LIPOMA EXCISION Right 11/26/2015   Procedure: EXCISION POSTERIOR NECK LIPOMA;  Surgeon: [name], DO;  Location: [facility];  Service: Plastics;  Laterality: Right;  . LUMBAR MICRODISCECTOMY Right    Dr. [name]     History reviewed. No pertinent family history.   Social History   Substance and Sexual Activity  Drug Use No     Social History   Substance and Sexual Activity  Alcohol Use Yes   Comment: occas     Social History   Tobacco Use  Smoking Status Never Smoker  Smokeless Tobacco Never Used     Outpatient Encounter Medications as of 07/24/2019  Medication Sig  . B COMPLEX-C PO Take 1,500 mg by mouth.  . calcium carbonate (OSCAL) 1500 (600 Ca) MG  TABS tablet Take 2 (two) times daily by mouth.  . doxylamine, Sleep, (UNISOM) 25 MG tablet Take 25 mg by mouth at bedtime as needed.  . etonogestrel-ethinyl estradiol (NUVARING) 0.12-0.015 MG/24HR vaginal ring Place 1 each vaginally every 28 (twenty-eight) days. Insert  vaginally and leave in place for 3 consecutive weeks, then remove for 1 week.  . loratadine (CLARITIN) 10 MG tablet Take 10 mg by mouth daily.  . Omega-3 Fatty Acids (FISH OIL) 1000 MG CAPS Take 3,000 mg by mouth daily.   Marland Kitchen venlafaxine XR (EFFEXOR-XR) 75 MG 24 hr capsule Take 75 mg by mouth daily.  . Vitamin D, Ergocalciferol, (DRISDOL) 1.25 MG (50000 UT) CAPS capsule Take one tablet wkly  . [DISCONTINUED] venlafaxine XR (EFFEXOR-XR) 37.5 MG 24 hr capsule TAKE 1 CAPSULE BY MOUTH DAILY WITH BREAKFAST (Patient taking differently: Take 75 mg by mouth. )   No facility-administered encounter medications on file as of 07/24/2019.    Allergies: Ceftin [cefuroxime axetil], Ciprofloxacin, Aspirin, Doxycycline, and Zithromax [azithromycin]  There is no height or weight on file to calculate BMI.  Last menstrual period 07/03/2019. Review of Systems: General:   Denies fever, chills, unexplained weight loss.  Optho/Auditory:   Denies visual changes, blurred vision/LOV ENT: Sore Throat + Respiratory:   Denies SOB, DOE more than baseline levels. Intermittent non-productive nocturnal cough +  Cardiovascular:   Denies chest pain, palpitations, new onset peripheral edema  Gastrointestinal:   Denies nausea, vomiting, diarrhea.  Genitourinary: Denies dysuria, freq/ urgency, flank pain or discharge from genitals.  Endocrine:     Denies hot or cold intolerance, polyuria, polydipsia. Musculoskeletal:   Denies unexplained myalgias, joint swelling, unexplained arthralgias, gait problems.  Skin:  Denies rash, suspicious lesions Neurological:  Denies dizziness, unexplained weakness, numbness  Psychiatric/Behavioral:   Denies mood changes, suicidal or homicidal ideations, hallucinations  Observations/Objective: No acute distress noted during telephone conversation  Assessment and Plan: Strep Test - Neg [place] requires serial Corona testing- SARS-CoV-2 Neg 07/22/2019 SARS-CoV-2 tested 07/23/2019-results  pending-RESULTS BEING VERIFIED Push fluids, Alternate OTC Acetaminophen and Ibuprofen as needed. Continue with OTC Zinc Lozanges as needed.  Follow Up Instructions: PRN   I discussed the assessment and treatment plan with the patient. The patient was provided an opportunity to ask questions and all were answered. The patient agreed with the plan and demonstrated an understanding of the instructions.   The patient was advised to call back or seek an in-person evaluation if the symptoms worsen or if the condition fails to improve as anticipated.  I provided 15 minutes of non-face-to-face time during this encounter.   Esaw Grandchild, NP

## 2019-07-24 NOTE — Assessment & Plan Note (Signed)
Assessment and Plan: Strep Test - Neg [place] requires serial Corona testing- SARS-CoV-2 Neg 07/22/2019 SARS-CoV-2 tested 07/23/2019-results pending-RESULTS BEING VERIFIED Push fluids, Alternate OTC Acetaminophen and Ibuprofen as needed. Continue with OTC Zinc Lozanges as needed.  Follow Up Instructions: PRN   I discussed the assessment and treatment plan with the patient. The patient was provided an opportunity to ask questions and all were answered. The patient agreed with the plan and demonstrated an understanding of the instructions.   The patient was advised to call back or seek an in-person evaluation if the symptoms worsen or if the condition fails to improve as anticipated.

## 2019-07-31 DIAGNOSIS — F4320 Adjustment disorder, unspecified: Secondary | ICD-10-CM | POA: Diagnosis not present

## 2019-08-07 DIAGNOSIS — F4320 Adjustment disorder, unspecified: Secondary | ICD-10-CM | POA: Diagnosis not present

## 2019-08-27 DIAGNOSIS — F4320 Adjustment disorder, unspecified: Secondary | ICD-10-CM | POA: Diagnosis not present

## 2019-09-04 DIAGNOSIS — F4320 Adjustment disorder, unspecified: Secondary | ICD-10-CM | POA: Diagnosis not present

## 2019-10-04 DIAGNOSIS — F4320 Adjustment disorder, unspecified: Secondary | ICD-10-CM | POA: Diagnosis not present

## 2019-10-09 DIAGNOSIS — M9903 Segmental and somatic dysfunction of lumbar region: Secondary | ICD-10-CM | POA: Diagnosis not present

## 2019-10-09 DIAGNOSIS — M9902 Segmental and somatic dysfunction of thoracic region: Secondary | ICD-10-CM | POA: Diagnosis not present

## 2019-10-09 DIAGNOSIS — M9901 Segmental and somatic dysfunction of cervical region: Secondary | ICD-10-CM | POA: Diagnosis not present

## 2019-10-09 DIAGNOSIS — M542 Cervicalgia: Secondary | ICD-10-CM | POA: Diagnosis not present

## 2019-10-12 DIAGNOSIS — F4320 Adjustment disorder, unspecified: Secondary | ICD-10-CM | POA: Diagnosis not present

## 2019-10-30 DIAGNOSIS — F4320 Adjustment disorder, unspecified: Secondary | ICD-10-CM | POA: Diagnosis not present

## 2019-11-07 DIAGNOSIS — F4320 Adjustment disorder, unspecified: Secondary | ICD-10-CM | POA: Diagnosis not present

## 2019-12-17 DIAGNOSIS — R635 Abnormal weight gain: Secondary | ICD-10-CM | POA: Diagnosis not present

## 2019-12-17 DIAGNOSIS — Z01419 Encounter for gynecological examination (general) (routine) without abnormal findings: Secondary | ICD-10-CM | POA: Diagnosis not present

## 2019-12-17 DIAGNOSIS — Z113 Encounter for screening for infections with a predominantly sexual mode of transmission: Secondary | ICD-10-CM | POA: Diagnosis not present

## 2019-12-17 DIAGNOSIS — Z124 Encounter for screening for malignant neoplasm of cervix: Secondary | ICD-10-CM | POA: Diagnosis not present

## 2019-12-24 DIAGNOSIS — F4320 Adjustment disorder, unspecified: Secondary | ICD-10-CM | POA: Diagnosis not present

## 2020-01-08 DIAGNOSIS — F4320 Adjustment disorder, unspecified: Secondary | ICD-10-CM | POA: Diagnosis not present

## 2020-03-09 ENCOUNTER — Telehealth: Payer: Self-pay | Admitting: Physician Assistant

## 2020-03-09 DIAGNOSIS — R519 Headache, unspecified: Secondary | ICD-10-CM

## 2020-03-09 NOTE — Telephone Encounter (Signed)
[place] for referral per [facility] [employer] [facility]. Referral has been placed. AS, CMA

## 2020-03-09 NOTE — Telephone Encounter (Signed)
Patient is requesting a neuro referral for a consultation in regards to chronic headaches that she has been having for a few months. Please place referral if applicable.

## 2020-03-09 NOTE — Addendum Note (Signed)
Addended by: Mickel Crow on: 03/09/2020 03:33 PM   Modules accepted: Orders

## 2020-03-30 ENCOUNTER — Other Ambulatory Visit: Payer: Self-pay | Admitting: Sports Medicine

## 2020-03-30 DIAGNOSIS — G4452 New daily persistent headache (NDPH): Secondary | ICD-10-CM

## 2020-03-30 DIAGNOSIS — G4459 Other complicated headache syndrome: Secondary | ICD-10-CM

## 2020-04-17 ENCOUNTER — Other Ambulatory Visit (HOSPITAL_COMMUNITY): Payer: Self-pay | Admitting: Sports Medicine

## 2020-04-18 ENCOUNTER — Ambulatory Visit
Admission: RE | Admit: 2020-04-18 | Discharge: 2020-04-18 | Disposition: A | Discharge status: Home / Self Care | Payer: 59 | Source: Ambulatory Visit | Attending: Sports Medicine | Admitting: Sports Medicine

## 2020-04-18 DIAGNOSIS — G4452 New daily persistent headache (NDPH): Secondary | ICD-10-CM | POA: Diagnosis not present

## 2020-04-18 DIAGNOSIS — G4459 Other complicated headache syndrome: Secondary | ICD-10-CM

## 2020-04-21 ENCOUNTER — Other Ambulatory Visit (HOSPITAL_COMMUNITY): Payer: Self-pay | Admitting: Sports Medicine

## 2020-04-24 DIAGNOSIS — Z3202 Encounter for pregnancy test, result negative: Secondary | ICD-10-CM | POA: Diagnosis not present

## 2020-04-24 DIAGNOSIS — Z30430 Encounter for insertion of intrauterine contraceptive device: Secondary | ICD-10-CM | POA: Diagnosis not present

## 2020-04-27 DIAGNOSIS — M9902 Segmental and somatic dysfunction of thoracic region: Secondary | ICD-10-CM | POA: Diagnosis not present

## 2020-04-27 DIAGNOSIS — M542 Cervicalgia: Secondary | ICD-10-CM | POA: Diagnosis not present

## 2020-04-27 DIAGNOSIS — M9901 Segmental and somatic dysfunction of cervical region: Secondary | ICD-10-CM | POA: Diagnosis not present

## 2020-04-27 DIAGNOSIS — M9903 Segmental and somatic dysfunction of lumbar region: Secondary | ICD-10-CM | POA: Diagnosis not present

## 2020-04-27 DIAGNOSIS — G4459 Other complicated headache syndrome: Secondary | ICD-10-CM | POA: Diagnosis not present

## 2020-05-01 ENCOUNTER — Other Ambulatory Visit: Payer: Self-pay | Admitting: Sports Medicine

## 2020-05-01 ENCOUNTER — Ambulatory Visit
Admission: RE | Admit: 2020-05-01 | Discharge: 2020-05-01 | Disposition: A | Discharge status: Home / Self Care | Payer: 59 | Source: Ambulatory Visit | Attending: Sports Medicine | Admitting: Sports Medicine

## 2020-05-01 ENCOUNTER — Other Ambulatory Visit: Payer: Self-pay

## 2020-05-01 DIAGNOSIS — M25571 Pain in right ankle and joints of right foot: Secondary | ICD-10-CM

## 2020-05-01 DIAGNOSIS — M7989 Other specified soft tissue disorders: Secondary | ICD-10-CM | POA: Diagnosis not present

## 2020-05-01 DIAGNOSIS — S99911A Unspecified injury of right ankle, initial encounter: Secondary | ICD-10-CM | POA: Diagnosis not present

## 2020-06-29 ENCOUNTER — Other Ambulatory Visit (HOSPITAL_COMMUNITY): Payer: Self-pay | Admitting: Sports Medicine

## 2020-07-17 ENCOUNTER — Telehealth: Payer: Self-pay | Admitting: Physician Assistant

## 2020-07-17 NOTE — Telephone Encounter (Signed)
Patient advised no apts available in office and suggested UC. Patient verbalized understanding and was agreeable. AS, CMA

## 2020-07-24 ENCOUNTER — Ambulatory Visit
Admission: EM | Admit: 2020-07-24 | Discharge: 2020-07-24 | Disposition: A | Discharge status: Home / Self Care | Payer: 59 | Attending: Internal Medicine | Admitting: Internal Medicine

## 2020-07-24 ENCOUNTER — Other Ambulatory Visit: Payer: Self-pay

## 2020-07-24 DIAGNOSIS — J029 Acute pharyngitis, unspecified: Secondary | ICD-10-CM | POA: Diagnosis not present

## 2020-07-24 LAB — POCT RAPID STREP A (OFFICE): Rapid Strep A Screen: NEGATIVE

## 2020-07-24 NOTE — ED Triage Notes (Signed)
Patient states she has had a sore throat as well as a mild cough for 5 days. Pt states she has had nausea and some vomiting but has that periodically anyway. Pt is aox4 and ambulatory.

## 2020-07-24 NOTE — Discharge Instructions (Signed)
Warm salt water gargle Tylenol as needed for pain Please quarantine until COVID-19 test results are available Increase oral fluid intake If symptoms worsen please return to urgent care We will call you with recommendations if lab results are abnormal.

## 2020-07-25 LAB — NOVEL CORONAVIRUS, NAA: SARS-CoV-2, NAA: NOT DETECTED

## 2020-07-25 LAB — SARS-COV-2, NAA 2 DAY TAT

## 2020-07-27 NOTE — ED Provider Notes (Signed)
[facility]    CSN: [id number] Arrival date & time: 07/24/20  1630      History   Chief Complaint Chief Complaint  Patient presents with  . Sore Throat    X 5 days  . Cough    Mild x 5 days    HPI Sherry Luna is a 35 y.o. female comes to the urgent care with a 5-day history of mild nonproductive cough, sore throat, nausea and vomiting.  No sick contacts.  Patient denies any nausea but has intermittent vomiting vomiting which is not unusual for her.  No difficulty swallowing.  No body aches.  No fever or chills.    HPI  Past Medical History:  Diagnosis Date  . COVID-19     Patient Active Problem List   Diagnosis Date Noted  . Sore throat 07/24/2019  . History of elective abortion 05/28/2019  . History of hypothyroidism 05/28/2019  . Adjustment disorder with depressed mood 05/28/2019  . Environmental and seasonal allergies 10/15/2018  . Mild concussion 04/11/2018  . Vitamin D insufficiency 12/19/2017  . Subclinical hypothyroidism 12/19/2017  . Elevated TSH 12/19/2017  . Consumes a vegan diet 12/19/2017  . Iron deficiency anemia 11/20/2017  . B12 deficiency 11/20/2017  . Mononeuropathy 11/20/2017  . BMI 30-34.9 11/20/2017  . Fatigue 04/28/2017  . h/o Exercise-induced bronchoconstriction 04/10/2017  . Sleep disturbance 01/02/2017  . Metatarsalgia of both feet 11/15/2016  . Right leg pain 10/31/2016  . Chronic right-sided low back pain with sciatica 09/29/2016  . Intervertebral disk disease 07/11/2016    Past Surgical History:  Procedure Laterality Date  . APPENDECTOMY    . CHOLECYSTECTOMY    . FRACTURE SURGERY     femur  . LIPOMA EXCISION Right 11/26/2015   Procedure: EXCISION POSTERIOR NECK LIPOMA;  Surgeon: [name], DO;  Location: [facility];  Service: Plastics;  Laterality: Right;  . LUMBAR MICRODISCECTOMY Right    Dr. [name]    OB History   No obstetric history on file.      Home Medications    Prior  to Admission medications   Medication Sig Start Date End Date Taking? Authorizing Provider  B COMPLEX-C PO Take 1,500 mg by mouth.   Yes [provider]  calcium carbonate (OSCAL) 1500 (600 Ca) MG TABS tablet Take 2 (two) times daily by mouth.   Yes [provider]  doxylamine, Sleep, (UNISOM) 25 MG tablet Take 25 mg by mouth at bedtime as needed.   Yes [provider]  etonogestrel-ethinyl estradiol (NUVARING) 0.12-0.015 MG/24HR vaginal ring Place 1 each vaginally every 28 (twenty-eight) days. Insert vaginally and leave in place for 3 consecutive weeks, then remove for 1 week.   Yes [provider]  loratadine (CLARITIN) 10 MG tablet Take 10 mg by mouth daily.   Yes [provider]  Omega-3 Fatty Acids (FISH OIL) 1000 MG CAPS Take 3,000 mg by mouth daily.    Yes [provider]  venlafaxine XR (EFFEXOR-XR) 75 MG 24 hr capsule Take 75 mg by mouth daily. 07/22/19  Yes [provider]  Vitamin D, Ergocalciferol, (DRISDOL) 1.25 MG (50000 UT) CAPS capsule Take one tablet wkly 05/28/19  Yes Opalski, Neoma Laming, DO    Family History History reviewed. No pertinent family history.  Social History Social History   Tobacco Use  . Smoking status: Never Smoker  . Smokeless tobacco: Never Used  Vaping Use  . Vaping Use: Never used  Substance Use Topics  .  Alcohol use: Yes    Comment: occas  . Drug use: No     Allergies   Ceftin [cefuroxime axetil], Ciprofloxacin, Aspirin, Doxycycline, and Zithromax [azithromycin]   Review of Systems Review of Systems  Constitutional: Negative for activity change, chills and fever.  HENT: Positive for congestion and sore throat.   Eyes: Negative.   Respiratory: Positive for cough. Negative for shortness of breath and wheezing.   Gastrointestinal: Positive for vomiting. Negative for nausea.  Musculoskeletal: Negative.  Negative for arthralgias and myalgias.  Neurological: Negative.      Physical  Exam Triage Vital Signs ED Triage Vitals  Enc Vitals Group     BP 07/24/20 1649 108/72     Pulse Rate 07/24/20 1649 63     Resp 07/24/20 1649 20     Temp 07/24/20 1649 98.1 F (36.7 C)     Temp Source 07/24/20 1649 Oral     SpO2 07/24/20 1649 98 %     Weight --      Height --      Head Circumference --      Peak Flow --      Pain Score 07/24/20 1658 4     Pain Loc --      Pain Edu? --      Excl. in [place]? --    No data found.  Updated Vital Signs BP 108/72 (BP Location: Left Arm)   Pulse 63   Temp 98.1 F (36.7 C) (Oral)   Resp 20   LMP 07/11/2020 (Exact Date)   SpO2 98%   Visual Acuity Right Eye Distance:   Left Eye Distance:   Bilateral Distance:    Right Eye Near:   Left Eye Near:    Bilateral Near:     Physical Exam Vitals and nursing note reviewed.  Constitutional:      General: She is not in acute distress.    Appearance: She is not ill-appearing.  HENT:     Right Ear: Tympanic membrane normal.     Left Ear: Tympanic membrane normal.     Mouth/Throat:     Mouth: Mucous membranes are moist. No oral lesions.     Pharynx: Posterior oropharyngeal erythema present. No pharyngeal swelling.     Tonsils: 0 on the right. 0 on the left.  Cardiovascular:     Rate and Rhythm: Normal rate and regular rhythm.  Musculoskeletal:     Cervical back: Normal range of motion.  Lymphadenopathy:     Cervical: No cervical adenopathy.  Neurological:     Mental Status: She is alert.      UC Treatments / Results  Labs (all labs ordered are listed, but only abnormal results are displayed) Labs Reviewed  CULTURE, GROUP A STREP [facility])  NOVEL CORONAVIRUS, NAA   Narrative:    Performed at:  [address] [place], [place]  [id number] Lab Director: Rush Farmer MD, Phone:  [phone]  SARS-COV-2, NAA 2 DAY TAT   Narrative:    Performed at:  [facility] [address] [place], [place]  [id number] Lab Director: Rush Farmer MD,  Phone:  [phone]  POCT RAPID STREP A (OFFICE)    EKG   Radiology No results found.  Procedures Procedures (including critical care time)  Medications Ordered in UC Medications - No data to display  Initial Impression / Assessment and Plan / UC Course  I have reviewed the triage vital signs and the nursing notes.  Pertinent labs &  imaging results that were available during my care of the patient were reviewed by me and considered in my medical decision making (see chart for details).     1.  Acute viral pharyngitis: Point-of-care strep is negative Throat cultures have been sent COVID-19 PCR test has been sent Tylenol as needed for pain Please quarantine until COVID-19 test results are available If symptoms worsen please return to urgent care We will call you with lab results if abnormal Warm salt water gargle and over-the-counter Cepacol lozenges. Final Clinical Impressions(s) / UC Diagnoses   Final diagnoses:  Acute viral pharyngitis     Discharge Instructions     Warm salt water gargle Tylenol as needed for pain Please quarantine until COVID-19 test results are available Increase oral fluid intake If symptoms worsen please return to urgent care We will call you with recommendations if lab results are abnormal.   ED Prescriptions    None     PDMP not reviewed this encounter.   Chase Picket, MD 07/27/20 [phone]

## 2020-07-28 LAB — CULTURE, GROUP A STREP (THRC)

## 2020-10-31 ENCOUNTER — Encounter: Payer: Self-pay | Admitting: Physician Assistant

## 2020-11-16 ENCOUNTER — Ambulatory Visit: Payer: 59 | Admitting: Physician Assistant

## 2020-11-18 ENCOUNTER — Ambulatory Visit (INDEPENDENT_AMBULATORY_CARE_PROVIDER_SITE_OTHER): Payer: 59 | Admitting: Physician Assistant

## 2020-11-18 ENCOUNTER — Encounter: Payer: Self-pay | Admitting: Physician Assistant

## 2020-11-18 VITALS — Ht 65.0 in | Wt 188.0 lb

## 2020-11-18 DIAGNOSIS — M791 Myalgia, unspecified site: Secondary | ICD-10-CM

## 2020-11-18 DIAGNOSIS — Z87820 Personal history of traumatic brain injury: Secondary | ICD-10-CM | POA: Diagnosis not present

## 2020-11-18 DIAGNOSIS — F32A Depression, unspecified: Secondary | ICD-10-CM

## 2020-11-18 DIAGNOSIS — R5383 Other fatigue: Secondary | ICD-10-CM

## 2020-11-18 DIAGNOSIS — M2550 Pain in unspecified joint: Secondary | ICD-10-CM | POA: Diagnosis not present

## 2020-11-18 DIAGNOSIS — Z8616 Personal history of COVID-19: Secondary | ICD-10-CM

## 2020-11-18 DIAGNOSIS — R519 Headache, unspecified: Secondary | ICD-10-CM | POA: Diagnosis not present

## 2020-11-18 DIAGNOSIS — F419 Anxiety disorder, unspecified: Secondary | ICD-10-CM

## 2020-11-18 MED ORDER — DULOXETINE HCL 20 MG PO CPEP: 20.0000 mg | ORAL_CAPSULE | Freq: Every day | ORAL | 1 refills | Status: DC

## 2020-11-18 NOTE — Progress Notes (Signed)
Telehealth office visit note for Sherry Reid, PA-C- at [facility]   I connected with current patient today by telephone and verified that I am speaking with the correct person   . Location of the patient: Home . Location of the provider: Office - This visit type was conducted due to national recommendations for restrictions regarding the COVID-19 Pandemic (e.g. social distancing) in an effort to limit this patient's exposure and mitigate transmission in our community.    - No physical exam could be performed with this format, beyond that communicated to [place] by the patient/ family members as noted.   - Additionally my office staff/ schedulers were to discuss with the patient that there may be a monetary charge related to this service, depending on their medical insurance.  My understanding is that patient understood and consented to proceed.     _________________________________________________________________________________   History of Present Illness: Patient calls in with multiple complaints/concerns. Reports a prior history of COVID-19 in 05/2019 which she recovered without major complication after about 8 weeks. States in the spring 2021 started a new job where she travels a lot and is a high stress environment and was doing fine until she got sick in the summer 2021 again. Did numerous Covid tests which were negative. Feels like ever since then she has had issues with post exercise malaise, extreme fatigue especially after intense exercise, recurrent viral  Illnesses and strep throat, widespread pain of lower and upper body, and periodic headache which felt like a daily migraine. States headaches mildly improved and has them once every couple of weeks since using Flonase. 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 Per patient's mychart message, also reports loss of sensation along lateral thigh and tinnitus.  Patient has tried ibuprofen, meditation, psychotherapy, exercise, quercetin and doxylamine. Reports in the past was on Effexor for anxiety and depression and inquiring if restarting SNRI or SSRI would be beneficial for mood and widespread pain. Patient is currently working out of town so was unable to come in to the office. States had blood work in October 2021 which was negative for Lyme, CMV, thyroid disease, iron deficiency anemia and was positive for previous EBV infx. CRP was elevated.     GAD 7 : Generalized Anxiety Score 11/18/2020 10/15/2018  Nervous, Anxious, on Edge 1 0  Control/stop worrying 0 0  Worry too much - different things 0 0  Trouble relaxing 0 0  Restless 0 0  Easily annoyed or irritable 0 0  Afraid - awful might happen 0 0  Total GAD 7 Score 1 0  Anxiety Difficulty Not difficult at all Not difficult at all    Depression screen [facility] 2/9 11/18/2020 05/28/2019 11/26/2018 10/15/2018 12/19/2017  Decreased Interest 0 0 1 0 1  Down, Depressed, Hopeless 0 0 1 0 0  PHQ - 2 Score 0 0 2 0 1  Altered sleeping _0 Tired, decreased energy 3 0 1 0 2  Change in appetite 0 0 0 3 1  Feeling bad or failure about yourself  0 1 1 0 1  Trouble concentrating 2 0 0 0 1  Moving slowly or fidgety/restless 0 0 0 0 0  Suicidal thoughts 0 0 0 0 0  PHQ-9 Score _1 Difficult doing work/chores Somewhat difficult Not difficult at all Somewhat difficult Not difficult at all Somewhat difficult  Impression and Recommendations:     1. Fatigue, unspecified type   2. History of concussion   3. Nonintractable episodic headache, unspecified headache type   4. Polyarthralgia   5. Myalgia   6. Anxiety and depression   7. History of COVID-19     Fatigue, Polyarthralgia, Myalgia, History of Covid-19 infection, History of concussion, Non-intractable episodic headache: -Patient with multiple complaints x about a year with unclear etiology. Recommend obtaining copy of lab results however  patient states labs are in [place] (performed at another location). 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 Will trial patient on SNRI- duloxetine for chronic musculoskeletal pain and mood. Discussed with patient potential medication side effects and advised to let me know if unable to tolerate. Will start at a lower dose and adjust dose as indicated. -Recommend repeating CRP and possibly additional labs such as ANA, ESR, RF, Anti-CCP if not previously performed. Will further discuss at follow up visit. -Follow up in 8 weeks to reassess symptoms.  Anxiety and depression: -GAD-7 score of 1, PHQ-9 score of 6. -Will start patient on SNRI- duloxetine 20 mg.  -Follow up in 8 weeks.     - As part of my medical decision making, I reviewed the following data within the [place] History obtained from pt /family, CMA notes reviewed and incorporated if applicable, Labs reviewed, Radiograph/ tests reviewed if applicable and OV notes from prior OV's with me, as well as any other specialists she/he has seen since seeing me last, were all reviewed and used in my medical decision making process today.    - Additionally, when appropriate, discussion had with patient regarding our treatment plan, and their biases/concerns about that plan were used in my medical decision making today.    - The patient agreed with the plan and demonstrated an understanding of the instructions.   No barriers to understanding were identified.     - The patient was advised to call back or seek an in-person evaluation if the symptoms worsen or if the condition fails to improve as anticipated.   Return in about 8 weeks (around 01/13/2021) for Mood, polyarthralgia, fatigue  .    Orders Placed This Encounter  Procedures  . Ambulatory referral to Neurology    Meds ordered this encounter   Medications  . DULoxetine (CYMBALTA) 20 MG capsule    Sig: Take 1 capsule (20 mg total) by mouth daily.    Dispense:  30 capsule    Refill:  1    Order Specific Question:   Supervising Provider    Answer:   Beatrice Lecher D [[id number]]    Medications Discontinued During This Encounter  Medication Reason  . B COMPLEX-C PO Discontinued by provider  . calcium carbonate (OSCAL) 1500 (600 Ca) MG TABS tablet Discontinued by provider  . Vitamin D, Ergocalciferol, (DRISDOL) 1.25 MG (50000 UT) CAPS capsule Discontinued by provider  . etonogestrel-ethinyl estradiol (NUVARING) 0.12-0.015 MG/24HR vaginal ring Discontinued by provider  . Omega-3 Fatty Acids (FISH OIL) 1000 MG CAPS Discontinued by provider  . pantoprazole (PROTONIX) 40 MG tablet Discontinued by provider  . venlafaxine XR (EFFEXOR-XR) 37.5 MG 24 hr capsule Discontinued by provider  . venlafaxine XR (EFFEXOR-XR) 75 MG 24 hr capsule Discontinued by provider       Time spent on visit telephone encounter was 15 minutes.      The 21st Century Cures Act was signed into law  in 2016 which includes the topic of electronic health records.  This provides immediate access to information in MyChart.  This includes consultation notes, operative notes, office notes, lab results and pathology reports.  If you have any questions about what you read please let us know at your next visit or call us at the office.  We are right here with you.   __________________________________________________________________________________     Patient Care Team    Relationship Specialty Notifications Start End  Sherry Luna, [place] PCP - General   10/13/19   Gerda Diss, DO Consulting Physician Sports Medicine  11/20/17   Lyndal Pulley, DO Consulting Physician Family Medicine  10/15/18    Comment: Treatment of her concussion-gives her Effexor  Shuping, Rockney Ghee, MD Referring Physician Physical Medicine and Rehabilitation  10/15/18    Comment:  POST-CONCUSSION  Bobbye Charleston, MD Consulting Physician Obstetrics and Gynecology  11/26/18      -Vitals obtained; medications/ allergies reconciled;  personal medical, social, Sx etc.histories were updated by CMA, reviewed by me and are reflected in chart   Patient Active Problem List   Diagnosis Date Noted  . Sore throat 07/24/2019  . History of elective abortion 05/28/2019  . History of hypothyroidism 05/28/2019  . Adjustment disorder with depressed mood 05/28/2019  . Environmental and seasonal allergies 10/15/2018  . Mild concussion 04/11/2018  . Vitamin D insufficiency 12/19/2017  . Subclinical hypothyroidism 12/19/2017  . Elevated TSH 12/19/2017  . Consumes a vegan diet 12/19/2017  . Iron deficiency anemia 11/20/2017  . B12 deficiency 11/20/2017  . Mononeuropathy 11/20/2017  . BMI 30-34.9 11/20/2017  . Fatigue 04/28/2017  . h/o Exercise-induced bronchoconstriction 04/10/2017  . Sleep disturbance 01/02/2017  . Metatarsalgia of both feet 11/15/2016  . Right leg pain 10/31/2016  . Chronic right-sided low back pain with sciatica 09/29/2016  . Intervertebral disk disease 07/11/2016     Current Meds  Medication Sig  . doxylamine, Sleep, (UNISOM) 25 MG tablet Take 25 mg by mouth at bedtime as needed.  . DULoxetine (CYMBALTA) 20 MG capsule Take 1 capsule (20 mg total) by mouth daily.  . fluticasone (FLONASE) 50 MCG/ACT nasal spray PLACE 2 SPRAYS INTO EACH NOSTRIL DAILY.  Marland Kitchen loratadine (CLARITIN) 10 MG tablet Take 10 mg by mouth daily.     Allergies:  Allergies  Allergen Reactions  . Ceftin [Cefuroxime Axetil] Shortness Of Breath  . Ciprofloxacin Shortness Of Breath  . Aspirin Nausea Only  . Doxycycline Nausea Only  . Zithromax [Azithromycin] Swelling     ROS:  See above HPI for pertinent positives and negatives   Objective:   Height 5' 5" (1.651 m), weight 188 lb (85.3 kg).  (if some vitals are omitted, this means that patient was UNABLE to obtain them.  ) General: A & O * 3; sounds in no acute distress Respiratory: speaking in full sentences, no conversational dyspnea Psych: insight appears good, mood- appears full

## 2020-12-07 ENCOUNTER — Encounter: Payer: Self-pay | Admitting: Neurology

## 2020-12-21 ENCOUNTER — Ambulatory Visit (INDEPENDENT_AMBULATORY_CARE_PROVIDER_SITE_OTHER): Payer: 59 | Admitting: Nurse Practitioner

## 2020-12-21 VITALS — BP 106/61 | HR 53 | Temp 97.6°F | Resp 18

## 2020-12-21 DIAGNOSIS — Z8616 Personal history of COVID-19: Secondary | ICD-10-CM | POA: Diagnosis not present

## 2020-12-21 DIAGNOSIS — R5381 Other malaise: Secondary | ICD-10-CM | POA: Insufficient documentation

## 2020-12-21 DIAGNOSIS — M2550 Pain in unspecified joint: Secondary | ICD-10-CM | POA: Diagnosis not present

## 2020-12-21 NOTE — Patient Instructions (Addendum)
History of Covid Fatigue Brain fog:  Stay well hydrated  Stay active  Deep breathing exercises  May start vitamin C 2,000 mg daily, vitamin D3 2,000 IU daily, Zinc 220 mg daily  May take tylenol or fever or pain   Will order PT  Will order Speech  Will place referral to PMR  Will order labs   Follow up:  Follow up in 2 weeks or sooner if needed

## 2020-12-21 NOTE — Progress Notes (Signed)
@Patient  ID: Sherry Luna, female    DOB: 01-16-1986, 35 y.o.   MRN: [id number]  Chief Complaint  Patient presents with   history of covid     Referring provider: Lorrene Reid, PA-C   HPI  Patient presents today for post-COVID care clinic visit.  Patient states that she was diagnosed with COVID in December 2020.  Patient was not hospitalized at that time but states that she was very ill.  She states that she continues to have ongoing fatigue and multiple joint pain.  Patient states that for the majority of the times that she was diagnosed with COVID she has been in [place].  She has been seen by a physician there in [place].  She did have a complete lab work-up.  She states that the work-up did not include an autoimmune work-up.  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  The preferred mode of transportation in [place] is by bicycle.  So she does still ride her bicycle often but has not been able to do any competitions or strenuous rides since she has had COVID.  She is trying to stay active and well-hydrated.  We discussed that we can do lab work to check for autoimmune disorder.  We discussed that she can be referred to PMR, physical therapy, speech therapy.  Patient states that she is continuing to have brain fog and speech therapy may be helpful with cognitive rehabilitation.  It is uncertain if she will be able to follow through with these referrals because she plans to go back to [place] in August and will be there for 6 months.  Her husband is from [place] and she does plan to eventually move there. Denies f/c/s, n/v/d, hemoptysis, PND, chest pain or edema.     Allergies  Allergen Reactions   Ceftin [Cefuroxime Axetil] Shortness Of Breath   Ciprofloxacin Shortness Of Breath   Aspirin Nausea Only   Doxycycline Nausea Only   Zithromax [Azithromycin] Swelling    Immunization History  Administered Date(s) Administered   Influenza,inj,Quad PF,6+ Mos  04/10/2017   PFIZER(Purple Top)SARS-COV-2 Vaccination 10/11/2019, 11/04/2019, 05/30/2020    Past Medical History:  Diagnosis Date   COVID-19     Tobacco History: Social History   Tobacco Use  Smoking Status Never  Smokeless Tobacco Never   Counseling given: Yes   Outpatient Encounter Medications as of 12/21/2020  Medication Sig   doxylamine, Sleep, (UNISOM) 25 MG tablet Take 25 mg by mouth at bedtime as needed.   DULoxetine (CYMBALTA) 20 MG capsule Take 1 capsule (20 mg total) by mouth daily.   fluticasone (FLONASE) 50 MCG/ACT nasal spray PLACE 2 SPRAYS INTO EACH NOSTRIL DAILY.   loratadine (CLARITIN) 10 MG tablet Take 10 mg by mouth daily.   No facility-administered encounter medications on file as of 12/21/2020.     Review of Systems  Review of Systems  Constitutional:  Positive for fatigue.  HENT: Negative.    Respiratory:  Positive for shortness of breath. Negative for cough.   Cardiovascular: Negative.   Gastrointestinal: Negative.   Musculoskeletal:  Positive for arthralgias.  Allergic/Immunologic: Negative.   Neurological: Negative.   Psychiatric/Behavioral: Negative.        Physical Exam  BP 106/61   Pulse (!) 53   Temp 97.6 F (36.4 C)   Resp 18   SpO2 100%   Wt Readings from Last 5 Encounters:  11/18/20 188 lb (85.3 kg)  11/26/18 192 lb 14.4 oz (87.5 kg)  08/23/18 195 lb 6.4 oz (88.6 kg)  05/31/18 197 lb (89.4 kg)  04/25/18 196 lb (88.9 kg)     Physical Exam Vitals and nursing note reviewed.  Constitutional:      General: She is not in acute distress.    Appearance: She is well-developed.  Cardiovascular:     Rate and Rhythm: Normal rate and regular rhythm.  Pulmonary:     Effort: Pulmonary effort is normal.     Breath sounds: Normal breath sounds.  Musculoskeletal:     Right lower leg: No edema.     Left lower leg: No edema.  Neurological:     Mental Status: She is alert and oriented to person, place, and time.  Psychiatric:         Mood and Affect: Mood normal.        Behavior: Behavior normal.     Lab Results:  CBC    Component Value Date/Time   WBC 6.6 11/26/2018 0857   WBC 5.5 04/25/2018 1015   RBC 4.53 11/26/2018 0857   RBC 4.32 04/25/2018 1015   HGB 14.7 11/26/2018 0857   HCT 42.5 11/26/2018 0857   PLT 253.0 04/25/2018 1015   PLT 244 12/13/2017 0843   MCV 94 11/26/2018 0857   MCH 32.5 11/26/2018 0857   MCH 32.5 04/20/2017 1059   MCHC 34.6 11/26/2018 0857   MCHC 34.7 04/25/2018 1015   RDW 12.2 11/26/2018 0857   LYMPHSABS 2.8 11/26/2018 0857   MONOABS 0.3 04/25/2018 1015   EOSABS 0.0 11/26/2018 0857   BASOSABS 0.0 11/26/2018 0857    BMET    Component Value Date/Time   NA 136 11/26/2018 0857   K 4.5 11/26/2018 0857   CL 102 11/26/2018 0857   CO2 20 11/26/2018 0857   GLUCOSE 108 (H) 11/26/2018 0857   GLUCOSE 90 04/25/2018 1015   BUN 9 11/26/2018 0857   CREATININE 1.09 (H) 11/26/2018 0857   CREATININE 0.70 04/20/2017 1059   CALCIUM 9.1 11/26/2018 0857   GFRNONAA 68 11/26/2018 0857   GFRAA 78 11/26/2018 0857     Assessment & Plan:   History of COVID-19 Fatigue Brain fog:  Stay well hydrated  Stay active  Deep breathing exercises  May start vitamin C 2,000 mg daily, vitamin D3 2,000 IU daily, Zinc 220 mg daily  May take tylenol or fever or pain   Will order PT  Will order Speech  Will place referral to PMR  Will order labs   Follow up:  Follow up in 2 weeks or sooner if needed     Fenton Foy, NP 12/21/2020

## 2020-12-21 NOTE — Assessment & Plan Note (Signed)
Fatigue Brain fog:  Stay well hydrated  Stay active  Deep breathing exercises  May start vitamin C 2,000 mg daily, vitamin D3 2,000 IU daily, Zinc 220 mg daily  May take tylenol or fever or pain   Will order PT  Will order Speech  Will place referral to PMR  Will order labs   Follow up:  Follow up in 2 weeks or sooner if needed

## 2020-12-23 LAB — RHEUMATOID FACTOR: Rheumatoid fact SerPl-aCnc: <10 IU/mL (ref <14.0)

## 2020-12-23 LAB — ANA: Anti Nuclear Antibody (ANA): POSITIVE — AB

## 2020-12-23 LAB — C-REACTIVE PROTEIN: CRP: 2 mg/L (ref 0–10)

## 2020-12-23 LAB — SEDIMENTATION RATE: Sed Rate: 2 mm/hr (ref 0–32)

## 2020-12-24 ENCOUNTER — Other Ambulatory Visit: Payer: Self-pay | Admitting: Nurse Practitioner

## 2020-12-24 DIAGNOSIS — R768 Other specified abnormal immunological findings in serum: Secondary | ICD-10-CM

## 2020-12-24 DIAGNOSIS — R5383 Other fatigue: Secondary | ICD-10-CM

## 2020-12-24 DIAGNOSIS — Z8616 Personal history of COVID-19: Secondary | ICD-10-CM

## 2020-12-24 DIAGNOSIS — M2550 Pain in unspecified joint: Secondary | ICD-10-CM

## 2021-01-01 ENCOUNTER — Other Ambulatory Visit: Payer: Self-pay | Admitting: Physician Assistant

## 2021-01-01 DIAGNOSIS — F419 Anxiety disorder, unspecified: Secondary | ICD-10-CM

## 2021-01-01 MED ORDER — DULOXETINE HCL 20 MG PO CPEP: 20.0000 mg | ORAL_CAPSULE | Freq: Every day | ORAL | 0 refills | Status: AC

## 2021-01-06 ENCOUNTER — Other Ambulatory Visit: Payer: Self-pay

## 2021-01-06 ENCOUNTER — Ambulatory Visit (INDEPENDENT_AMBULATORY_CARE_PROVIDER_SITE_OTHER): Payer: 59 | Admitting: Internal Medicine

## 2021-01-06 ENCOUNTER — Encounter: Payer: Self-pay | Admitting: Internal Medicine

## 2021-01-06 VITALS — BP 105/70 | HR 54 | Ht 64.5 in | Wt 212.4 lb

## 2021-01-06 DIAGNOSIS — M79604 Pain in right leg: Secondary | ICD-10-CM

## 2021-01-06 DIAGNOSIS — M7742 Metatarsalgia, left foot: Secondary | ICD-10-CM

## 2021-01-06 DIAGNOSIS — M7741 Metatarsalgia, right foot: Secondary | ICD-10-CM

## 2021-01-06 DIAGNOSIS — Z8616 Personal history of COVID-19: Secondary | ICD-10-CM | POA: Diagnosis not present

## 2021-01-06 DIAGNOSIS — R768 Other specified abnormal immunological findings in serum: Secondary | ICD-10-CM

## 2021-01-06 NOTE — Progress Notes (Signed)
Office Visit Note  Patient: Sherry Luna             Date of Birth: 03-11-1986           MRN: [id number]             PCP: Lorrene Reid, PA-C Referring: Fenton Foy, NP Visit Date: 01/06/2021  Subjective:  New Patient (Initial Visit) (Patient complains of upper extremity, low back, and lower extremity pain. Patient first noticed symptom onset x 1 year while recovering from a flu-like infection. )   History of Present Illness: Sherry Luna is a 35 y.o. female here for positive ANA with joint pains of multiple sites and fatigue.  Her current symptomatic problem started about 1 year ago she recalls some events preceding this with a moderately severe COVID infection in December 2020 then suffered a additional viral infection last year repeatedly confirmed negative for COVID but was associated with persistent severe fatigue also body aches.  Sometime after this she started noticing numerous problems including new headaches, visual changes, fatigue, mouth sores, concentration difficulty and word searching, disrupted or nonrestful sleep, and joint pain in multiple areas including arms legs and back.  A lot of these have improved over the interval without specific treatment right now her biggest problem is development of muscle and joint aches and generalized malaise particularly worsened after strenuous physical exertions.  Previously she has been extremely physically active with good exercise capacity and endurance with no similar problems before this past year.  She does not notice early fatigability during exercises.  She takes low-dose Cymbalta which predates current symptoms and oral antihistamines as needed for allergies and upper airway congestion.  Labs reviewed 12/2020 ESR 2 CRP 2 RF neg ANA pos   Activities of Daily Living:  Patient reports morning stiffness for 1 hour.   Patient Reports nocturnal pain.  Difficulty dressing/grooming: Reports Difficulty climbing stairs:  Reports Difficulty getting out of chair: Reports Difficulty using hands for taps, buttons, cutlery, and/or writing: Denies  Review of Systems  Constitutional:  Positive for fatigue.  HENT:  Positive for mouth sores. Negative for mouth dryness and nose dryness.   Eyes:  Positive for itching, visual disturbance and dryness. Negative for pain.  Respiratory:  Negative for cough, hemoptysis, shortness of breath and difficulty breathing.   Cardiovascular:  Positive for chest pain and palpitations. Negative for swelling in legs/feet.  Gastrointestinal:  Positive for abdominal pain. Negative for blood in stool, constipation and diarrhea.  Endocrine: Positive for increased urination.  Genitourinary:  Negative for painful urination.  Musculoskeletal:  Positive for joint pain, joint pain, joint swelling, myalgias, muscle weakness, morning stiffness, muscle tenderness and myalgias.  Skin:  Positive for color change. Negative for rash and redness.  Allergic/Immunologic: Positive for susceptible to infections.  Neurological:  Positive for dizziness, headaches, memory loss and weakness. Negative for numbness.  Hematological:  Negative for swollen glands.  Psychiatric/Behavioral:  Positive for confusion and sleep disturbance.    PMFS History:  Patient Active Problem List   Diagnosis Date Noted   Positive ANA (antinuclear antibody) 01/06/2021   History of COVID-19 12/21/2020   Multiple joint pain 12/21/2020   Physical deconditioning 12/21/2020   Sore throat 07/24/2019   History of elective abortion 05/28/2019   History of hypothyroidism 05/28/2019   Adjustment disorder with depressed mood 05/28/2019   Environmental and seasonal allergies 10/15/2018   Mild concussion 04/11/2018   Vitamin D insufficiency 12/19/2017   Subclinical hypothyroidism 12/19/2017  Elevated TSH 12/19/2017   Consumes a vegan diet 12/19/2017   Iron deficiency anemia 11/20/2017   B12 deficiency 11/20/2017   Mononeuropathy  11/20/2017   BMI 30-34.9 11/20/2017   Fatigue 04/28/2017   h/o Exercise-induced bronchoconstriction 04/10/2017   Sleep disturbance 01/02/2017   Metatarsalgia of both feet 11/15/2016   Right leg pain 10/31/2016   Chronic right-sided low back pain with sciatica 09/29/2016   Intervertebral disk disease 07/11/2016    Past Medical History:  Diagnosis Date   COVID-19     Family History  Problem Relation Age of Onset   Sjogren's syndrome Mother    Past Surgical History:  Procedure Laterality Date   APPENDECTOMY     CHOLECYSTECTOMY     FRACTURE SURGERY     femur   LIPOMA EXCISION Right 11/26/2015   Procedure: EXCISION POSTERIOR NECK LIPOMA;  Surgeon: [name], DO;  Location: [facility];  Service: Plastics;  Laterality: Right;   LUMBAR MICRODISCECTOMY Right    Dr. [name]   Social History   Social History Narrative   Not on file   Immunization History  Administered Date(s) Administered   Influenza,inj,Quad PF,6+ Mos 04/10/2017   PFIZER(Purple Top)SARS-COV-2 Vaccination 10/11/2019, 11/04/2019, 05/30/2020     Objective: Vital Signs: BP 105/70 (BP Location: Right Arm, Patient Position: Sitting, Cuff Size: Normal)   Pulse (!) 54   Ht 5' 4.5" (1.638 m)   Wt 212 lb 6.4 oz (96.3 kg)   BMI 35.90 kg/m    Physical Exam Constitutional:      Appearance: She is obese.  HENT:     Mouth/Throat:     Mouth: Mucous membranes are moist.     Pharynx: Oropharynx is clear.  Eyes:     Conjunctiva/sclera: Conjunctivae normal.     Comments: R pupil larger than left, b/l reactive  Cardiovascular:     Rate and Rhythm: Normal rate and regular rhythm.  Pulmonary:     Effort: Pulmonary effort is normal.     Breath sounds: Normal breath sounds.  Skin:    General: Skin is warm and dry.     Findings: No rash.  Neurological:     General: No focal deficit present.     Mental Status: She is alert.     Deep Tendon Reflexes: Reflexes normal.  Psychiatric:         Mood and Affect: Mood normal.     Musculoskeletal Exam:  Neck full ROM no tenderness Shoulders full ROM no tenderness or swelling Elbows hyperextensibility bilaterally, no swelling or tenderness Wrists full ROM no tenderness or swelling Fingers hyperextensible ROM, no tenderness or swelling Bilateral paraspinal muscle tenderness to palpation worse left than right Knees full ROM, right knee lateral joint line tenderness, left knee normal Ankles full ROM no tenderness or swelling MTPs full ROM no tenderness or swelling    Investigation: No additional findings.  Imaging: No results found.  Recent Labs: Lab Results  Component Value Date   WBC 6.6 11/26/2018   HGB 14.7 11/26/2018   PLT 253.0 04/25/2018   NA 136 11/26/2018   K 4.5 11/26/2018   CL 102 11/26/2018   CO2 20 11/26/2018   GLUCOSE 108 (H) 11/26/2018   BUN 9 11/26/2018   CREATININE 1.09 (H) 11/26/2018   BILITOT 0.4 11/26/2018   ALKPHOS 54 11/26/2018   AST 17 11/26/2018   ALT 9 11/26/2018   PROT 6.7 11/26/2018   ALBUMIN 4.1 11/26/2018   CALCIUM 9.1 11/26/2018   GFRAA 78 11/26/2018  Speciality Comments: No specialty comments available.  Procedures:  No procedures performed Allergies: Ceftin [cefuroxime axetil], Ciprofloxacin, Aspirin, Doxycycline, and Zithromax [azithromycin]   Assessment / Plan:     Visit Diagnoses: Positive ANA (antinuclear antibody)  History of COVID-19 - Plan: Anti-scleroderma antibody, RNP Antibody, Anti-Smith antibody, Sjogrens syndrome-A extractable nuclear antibody, Anti-DNA antibody, double-stranded, C3 and C4, ANA  Positive SSA no specific clinical criteria present on exam or history today so lower pretest probability.  Checking ENA antibodies also ANA with immunofluorescence assay for titer and pattern.  Discussed low positive titers may represent postinflammatory immunological response.  If no specific positive results could consider NCS/EMG with the exertional intolerance type  symptoms described.  Metatarsalgia of both feet  Bilateral foot pain localized to the distal foot and toes with intermittent and paresthesias sounding symptoms.  Not reproducible on exam or any visible changes at this time.  Right leg pain  Lateral knee joint line tenderness to palpation with intact range of motion and no effusion no joint instability.  No imaging recommended currently without other evidence of arthritis change.  Orders: Orders Placed This Encounter  Procedures   Anti-scleroderma antibody   RNP Antibody   Anti-Smith antibody   Sjogrens syndrome-A extractable nuclear antibody   Anti-DNA antibody, double-stranded   C3 and C4   ANA    No orders of the defined types were placed in this encounter.    Follow-Up Instructions: No follow-ups on file.   Collier Salina, MD  Note - This record has been created using [employer].  Chart creation errors have been sought, but may not always  have been located. Such creation errors do not reflect on  the standard of medical care.

## 2021-01-06 NOTE — Patient Instructions (Addendum)
Antinuclear Antibody Test Why am I having this test? This is a test that is used to help diagnose systemic lupus erythematosus (SLE) and other autoimmune diseases. An autoimmune disease is a disease in which the body's own defense (immune)system attacks its organs. What is being tested? This test checks for antinuclear antibodies (ANA) in the blood. The presence of ANA is associated with several autoimmune diseases. It is seen in almost allpatients with lupus. What kind of sample is taken?  A blood sample is required for this test. It is usually collected by insertinga needle into a blood vessel. How are the results reported? Your test results will be reported as either positive or negative. A false-positive result can occur. A false positive is incorrect because itmeans that a condition is present when it is not. What do the results mean? A positive test result may mean that you have: Lupus. Other autoimmune diseases, such as rheumatoid arthritis, scleroderma, or Sjgren syndrome. Conditions that may cause a false-positive result include: Liver dysfunction. Myasthenia gravis. Infectious mononucleosis. Talk with your health care provider about what your results mean. Questions to ask your health care provider Ask your health care provider, or the department that is doing the test: When will my results be ready? How will I get my results? What are my treatment options? What other tests do I need? What are my next steps? Summary This is a test that is used to help diagnose systemic lupus erythematosus (SLE) and other autoimmune diseases. An autoimmune disease is a disease in which the body's own defense (immune)system attacks the body. This test checks for antinuclear antibodies (ANA) in the blood. The presence of ANA is associated with several autoimmune diseases. It is seen in almost all patients with lupus. Your test results will be reported as either positive or negative. Talk with  your health care provider about what your results mean. This information is not intended to replace advice given to you by your health care provider. Make sure you discuss any questions you have with your healthcare provider. Anti-DNA Antibody Test Why am I having this test? The anti-DNA antibody test helps with the diagnosis and follow-up of systemic lupus erythematosus (SLE). It is also used to monitor treatment of thiscondition as the antibody decreases with successful therapy. What is being tested? This test measures the amount of anti-DNA antibody in the blood. This antibody is found in 65-80% of patients with active SLE. This antibody is not as commonin patients who have other diseases. What kind of sample is taken?  A blood sample is required for this test. It is usually collected by insertinga needle into a blood vessel. How are the results reported? Your test results will be reported as a value. Your test results may also be reported as positive, intermediate, or negative. Your health care provider will compare your results to normal ranges that were established after testing a large group of people (reference values). Reference values may vary among labs and hospitals. For this test, common reference values are: Positive: 10 or more international units/mL. Intermediate: 5-9 international units/mL. Negative: Less than 5 international units/mL. What do the results mean? Positive results, which are associated with results that are higher than the reference values, may indicate: Autoimmune disorders such as SLE. Infectious mononucleosis. Chronic liver conditions. Intermediate results mean that the anti-DNA antibody levels are higher thannormal, but not high enough to be considered positive. Negative results mean that you do not have the anti-DNA antibody that isassociated with these  conditions. Talk with your health care provider about what your results mean. Questions to ask your health  care provider Ask your health care provider, or the department that is doing the test: When will my results be ready? How will I get my results? What are my treatment options? What other tests do I need? What are my next steps? Summary The anti-DNA antibody test helps with the diagnosis and follow-up of systemic lupus erythematosus (SLE). It is also used to monitor treatment of this condition as the antibody decreases with successful therapy. This test measures the amount of anti-DNA antibody in the blood. Elevated levels of anti-DNA antibody can be seen in patients with SLE and certain other conditions.  Complement Assay Test Why am I having this test? Complement refers to a group of proteins that are part of the body's disease-fighting system (immune system). A complement assay test provides information about some or all of these proteins. You may have this test: To diagnose a lack, or deficiency, of certain complement proteins. Deficiencies can be passed from parent to child (inherited). To monitor an infection or autoimmune disease. If you have unexplained inflammation or swelling (edema). If you have bacterial infections again and again. What is being tested? This test can be used to measure: Total complement. This is the total number of protein complements in your blood. The number of each kind of complement in your blood. The nine main kinds of complement are labeled C1 through C9. Some of these complements, such as C3 and C4, are especially important and have many functions in the body. Depending on why you are having the test, your health care provider may test your total complement or only some individual complements, such as C3 and C4. The total complement assay test may be done before individual complements aretested. What kind of sample is taken?  A blood sample is required for this test. It is usually collected by insertinga needle into a blood vessel. Tell a health care  provider about: Any allergies you have. All medicines you are taking, including vitamins, herbs, eye drops, creams, and over-the-counter medicines. Any blood disorders you have. Any surgeries you have had. Any medical conditions you have. Whether you are pregnant or may be pregnant. How are the results reported? Your results will be reported as a value that tells you how much complement is in your blood. This will be given as units per milliliter of blood (units/mL) or as milligrams per deciliter of blood (mg/dL). Your results may be reportedas total complement, or as individual complements, or both. Your health care provider will compare your results to normal ranges that were established after testing a large group of people (reference ranges). Reference ranges may vary among labs and hospitals. For this test, reference ranges for some of the most commonly measured complement assays may be: Total complement: 30-75 units/mL. C2: 1-4 mg/dL. C3: 75-175 mg/dL. C4: 22-45 units/mL. What do the results mean? Results within reference ranges are considered normal, which means you have anormal amount of complement in your blood. Results that are higher than the reference ranges may be caused by: Inflammatory disease. Heart attack. Cancer. Complement deficiencies, or results lower than the reference ranges, may be caused by: Certain inherited conditions. Autoimmune disease. Certain liver diseases. Malnutrition. Certain types of anemia that result in breakdown of red blood cells (hemolytic anemia). Talk with your health care provider about what your results mean. Questions to ask your health care provider Ask your health care provider, or  the department that is doing the test: When will my results be ready? How will I get my results? What are my treatment options? What other tests do I need? What are my next steps? Summary Complement refers to a group of proteins that are part of the body's  disease-fighting system (immune system). A complement assay test can provide information about some or all of these proteins. You may have a complement assay test to help diagnose a complement deficiency, and to monitor some infections or autoimmune disease. Talk with your health care provider about what your results mean.

## 2021-01-08 LAB — RNP ANTIBODY: Ribonucleic Protein(ENA) Antibody, IgG: 5.5 AI — AB

## 2021-01-08 LAB — ANTI-SMITH ANTIBODY: ENA SM Ab Ser-aCnc: <1 AI

## 2021-01-08 LAB — SJOGRENS SYNDROME-A EXTRACTABLE NUCLEAR ANTIBODY: SSA (Ro) (ENA) Antibody, IgG: <1 AI

## 2021-01-08 LAB — C3 AND C4
C3 Complement: 122 mg/dL (ref 83–193)
C4 Complement: 27 mg/dL (ref 15–57)

## 2021-01-08 LAB — ANTI-DNA ANTIBODY, DOUBLE-STRANDED: ds DNA Ab: 1 IU/mL

## 2021-01-08 LAB — ANTI-SCLERODERMA ANTIBODY: Scleroderma (Scl-70) (ENA) Antibody, IgG: <1 AI

## 2021-01-08 LAB — ANA: Anti Nuclear Antibody (ANA): NEGATIVE

## 2021-01-13 ENCOUNTER — Encounter: Payer: Self-pay | Admitting: Physician Assistant

## 2021-01-13 ENCOUNTER — Other Ambulatory Visit: Payer: Self-pay

## 2021-01-13 ENCOUNTER — Ambulatory Visit (INDEPENDENT_AMBULATORY_CARE_PROVIDER_SITE_OTHER): Payer: 59 | Admitting: Physician Assistant

## 2021-01-13 VITALS — BP 97/60 | HR 66 | Temp 97.3°F | Ht 64.5 in | Wt 217.3 lb

## 2021-01-13 DIAGNOSIS — R768 Other specified abnormal immunological findings in serum: Secondary | ICD-10-CM

## 2021-01-13 DIAGNOSIS — F32A Depression, unspecified: Secondary | ICD-10-CM

## 2021-01-13 DIAGNOSIS — M2550 Pain in unspecified joint: Secondary | ICD-10-CM | POA: Diagnosis not present

## 2021-01-13 DIAGNOSIS — F419 Anxiety disorder, unspecified: Secondary | ICD-10-CM

## 2021-01-13 NOTE — Progress Notes (Signed)
Established Patient Office Visit  Subjective:  Patient ID: Sherry Luna, female    DOB: 1985-08-21  Age: 35 y.o. MRN: [id number]  CC:  Chief Complaint  Patient presents with   Follow-up   Fatigue    HPI Sherry Luna presents for follow up on mood and general malaise. Patient was started on duloxetine for mood and reports medication has also helped with joint pain and muscle aches. States is sleeping better. Will be returning to [place] soon. Reports she is able to recuperate easier from strenuous workout/acitvity. Was evaluated by rheumatology and recently had work-up done.  Past Medical History:  Diagnosis Date   COVID-19     Past Surgical History:  Procedure Laterality Date   APPENDECTOMY     CHOLECYSTECTOMY     FRACTURE SURGERY     femur   LIPOMA EXCISION Right 11/26/2015   Procedure: EXCISION POSTERIOR NECK LIPOMA;  Surgeon: [name], DO;  Location: [facility];  Service: Plastics;  Laterality: Right;   LUMBAR MICRODISCECTOMY Right    Dr. [name]    Family History  Problem Relation Age of Onset   Sjogren's syndrome Mother     Social History   Socioeconomic History   Marital status: Divorced    Spouse name: Not on file   Number of children: Not on file   Years of education: Not on file   Highest education level: Not on file  Occupational History   Not on file  Tobacco Use   Smoking status: Never   Smokeless tobacco: Never  Vaping Use   Vaping Use: Never used  Substance and Sexual Activity   Alcohol use: Yes    Comment: Occasionally   Drug use: No   Sexual activity: Yes    Birth control/protection: I.U.D.  Other Topics Concern   Not on file  Social History Narrative   Not on file   Social Determinants of Health   Financial Resource Strain: Not on file  Food Insecurity: Not on file  Transportation Needs: Not on file  Physical Activity: Not on file  Stress: Not on file  Social Connections: Not on file  Intimate  Partner Violence: Not on file    Outpatient Medications Prior to Visit  Medication Sig Dispense Refill   B Complex Vitamins (B COMPLEX PO) Take by mouth daily.     doxylamine, Sleep, (UNISOM) 25 MG tablet Take 25 mg by mouth at bedtime as needed.     DULoxetine (CYMBALTA) 20 MG capsule Take 1 capsule (20 mg total) by mouth daily. 90 capsule 0   fluticasone (FLONASE) 50 MCG/ACT nasal spray PLACE 2 SPRAYS INTO EACH NOSTRIL DAILY. 16 g 2   loratadine (CLARITIN) 10 MG tablet Take 10 mg by mouth as needed.     No facility-administered medications prior to visit.    Allergies  Allergen Reactions   Ceftin [Cefuroxime Axetil] Shortness Of Breath   Ciprofloxacin Shortness Of Breath   Aspirin Nausea Only   Doxycycline Nausea Only   Zithromax [Azithromycin] Swelling    ROS Review of Systems A fourteen system review of systems was performed and found to be positive as per HPI.   Objective:    Physical Exam General:  Well Developed, well nourished, appropriate for stated age.  Neuro:  Alert and oriented,  extra-ocular muscles intact  HEENT:  Normocephalic, atraumatic, neck supple Skin:  no gross rash, warm, pink. Cardiac:  RRR, S1 S2 Respiratory:  CTA B/L, Not using accessory muscles, speaking  in full sentences- unlabored. Vascular:  Ext warm, no cyanosis apprec.; cap RF less 2 sec. Psych:  No HI/SI, judgement and insight good, Euthymic mood. Full Affect.  BP 97/60   Pulse 66   Temp (!) 97.3 F (36.3 C)   Ht 5' 4.5" (1.638 m)   Wt 217 lb 4.8 oz (98.6 kg)   SpO2 100%   BMI 36.72 kg/m  Wt Readings from Last 3 Encounters:  01/13/21 217 lb 4.8 oz (98.6 kg)  01/06/21 212 lb 6.4 oz (96.3 kg)  11/18/20 188 lb (85.3 kg)     Health Maintenance Due  Topic Date Due   HIV Screening  Never done   Hepatitis C Screening  Never done   TETANUS/TDAP  Never done   PAP SMEAR-Modifier  Never done   INFLUENZA VACCINE  01/11/2021    There are no preventive care reminders to display for  this patient.  Lab Results  Component Value Date   TSH 1.180 11/26/2018   Lab Results  Component Value Date   WBC 6.6 11/26/2018   HGB 14.7 11/26/2018   HCT 42.5 11/26/2018   MCV 94 11/26/2018   PLT 253.0 04/25/2018   Lab Results  Component Value Date   NA 136 11/26/2018   K 4.5 11/26/2018   CO2 20 11/26/2018   GLUCOSE 108 (H) 11/26/2018   BUN 9 11/26/2018   CREATININE 1.09 (H) 11/26/2018   BILITOT 0.4 11/26/2018   ALKPHOS 54 11/26/2018   AST 17 11/26/2018   ALT 9 11/26/2018   PROT 6.7 11/26/2018   ALBUMIN 4.1 11/26/2018   CALCIUM 9.1 11/26/2018   GFR 92.73 04/25/2018   Lab Results  Component Value Date   CHOL 169 11/26/2018   Lab Results  Component Value Date   HDL 51 11/26/2018   Lab Results  Component Value Date   LDLCALC 91 11/26/2018   Lab Results  Component Value Date   TRIG 133 11/26/2018   Lab Results  Component Value Date   CHOLHDL 3.3 11/26/2018   Lab Results  Component Value Date   HGBA1C 5.2 11/26/2018      Assessment & Plan:   Problem List Items Addressed This Visit       Other   Positive ANA (antinuclear antibody)   Other Visit Diagnoses     Anxiety and depression    -  Primary   Polyarthralgia          Anxiety and depression: -GAD-7 score of  1, PHQ-9 score of 3 (improved from prior). -Recommend to continue current medication regimen. Patient will be relocating to [place] and plans to get established with an [place] provider.   Polyarthralgia, Positive ANA: -Improved. -Reviewed rheumatology consultation and labs 01/06/2021 and 12/21/2020. RNP antibody positive, ANA positive (12/21/2020) and most recently negative. Recommend to follow Rheumatology recommendations. -Recommend to continue with plant based diet and exercise regimen.        No orders of the defined types were placed in this encounter.   Follow-up: Return for mood in the near future.   Note:  This note was prepared with assistance of Dragon voice  recognition software. Occasional wrong-word or sound-a-like substitutions may have occurred due to the inherent limitations of voice recognition software.  Lorrene Reid, PA-C

## 2021-01-15 ENCOUNTER — Telehealth: Payer: Self-pay

## 2021-01-15 NOTE — Telephone Encounter (Signed)
Patient called stating she had labwork at her appointment with Dr. Benjamine Mola on 01/06/21 and requested a return call with the results.  Patient states she will not be home on Monday, 01/18/21 but you can leave a message on her voicemail or send a MyChart message.

## 2021-01-19 ENCOUNTER — Encounter: Payer: Self-pay | Admitting: Radiology

## 2021-01-19 NOTE — Telephone Encounter (Signed)
Lab results are not typical for any particular autoimmune disease, interestingly her ANA test is negative on repeat testing. I have seen the RNP antibody test positive in individuals with reactive inflammatory symptoms from certain infections including COVID. In those cases it usually decreases over time without specific treatment. I would recommend we monitor this and recheck in 3-6 months. For the muscle fatigue I can refer to neurology like we discussed for nerve study or EMG, or can wait if she prefers.

## 2021-02-02 ENCOUNTER — Ambulatory Visit: Payer: 59 | Admitting: Internal Medicine

## 2021-02-25 ENCOUNTER — Ambulatory Visit: Payer: 59 | Admitting: Neurology

## 2021-08-30 ENCOUNTER — Encounter: Payer: Self-pay | Admitting: Physician Assistant
